# Patient Record
Sex: Male | Born: 1970 | Race: White | Hispanic: No | Marital: Married | Smoking: Former smoker
Health system: Southern US, Community
[De-identification: ages and names within clinical notes are randomized; demographics above are authoritative.]

## PROBLEM LIST (undated history)

## (undated) DIAGNOSIS — R42 Dizziness and giddiness: Secondary | ICD-10-CM

## (undated) DIAGNOSIS — G43009 Migraine without aura, not intractable, without status migrainosus: Secondary | ICD-10-CM

## (undated) DIAGNOSIS — K219 Gastro-esophageal reflux disease without esophagitis: Secondary | ICD-10-CM

## (undated) DIAGNOSIS — I2510 Atherosclerotic heart disease of native coronary artery without angina pectoris: Secondary | ICD-10-CM

## (undated) HISTORY — PX: ESOPHAGEAL DILATION: SHX303

## (undated) HISTORY — DX: Migraine without aura, not intractable, without status migrainosus: G43.009

## (undated) HISTORY — DX: Dizziness and giddiness: R42

---

## 2012-05-23 ENCOUNTER — Ambulatory Visit
Admission: RE | Admit: 2012-05-23 | Discharge: 2012-05-23 | Disposition: A | Discharge status: Home / Self Care | Payer: BC Managed Care – PPO | Source: Ambulatory Visit | Attending: Otolaryngology | Admitting: Otolaryngology

## 2012-05-23 ENCOUNTER — Other Ambulatory Visit: Payer: Self-pay | Admitting: Otolaryngology

## 2012-05-23 DIAGNOSIS — R51 Headache: Secondary | ICD-10-CM

## 2012-06-06 ENCOUNTER — Other Ambulatory Visit: Payer: Self-pay | Admitting: Family Medicine

## 2012-06-06 DIAGNOSIS — R209 Unspecified disturbances of skin sensation: Secondary | ICD-10-CM

## 2012-06-10 ENCOUNTER — Ambulatory Visit
Admission: RE | Admit: 2012-06-10 | Discharge: 2012-06-10 | Disposition: A | Discharge status: Home / Self Care | Payer: BC Managed Care – PPO | Source: Ambulatory Visit | Attending: Family Medicine | Admitting: Family Medicine

## 2012-06-10 DIAGNOSIS — R209 Unspecified disturbances of skin sensation: Secondary | ICD-10-CM

## 2012-06-10 MED ORDER — GADOBENATE DIMEGLUMINE 529 MG/ML IV SOLN
17.0000 mL | Freq: Once | INTRAVENOUS | Status: AC | PRN
  Administered 2012-06-10: 17 mL via INTRAVENOUS

## 2014-05-17 ENCOUNTER — Emergency Department (HOSPITAL_BASED_OUTPATIENT_CLINIC_OR_DEPARTMENT_OTHER)
Admission: EM | Admit: 2014-05-17 | Discharge: 2014-05-17 | Disposition: A | Discharge status: Home / Self Care | Payer: BC Managed Care – PPO | Attending: Emergency Medicine | Admitting: Emergency Medicine

## 2014-05-17 ENCOUNTER — Encounter (HOSPITAL_BASED_OUTPATIENT_CLINIC_OR_DEPARTMENT_OTHER): Payer: Self-pay

## 2014-05-17 DIAGNOSIS — Z79899 Other long term (current) drug therapy: Secondary | ICD-10-CM | POA: Insufficient documentation

## 2014-05-17 DIAGNOSIS — Y9389 Activity, other specified: Secondary | ICD-10-CM | POA: Diagnosis not present

## 2014-05-17 DIAGNOSIS — Z9889 Other specified postprocedural states: Secondary | ICD-10-CM | POA: Insufficient documentation

## 2014-05-17 DIAGNOSIS — T18108A Unspecified foreign body in esophagus causing other injury, initial encounter: Secondary | ICD-10-CM

## 2014-05-17 DIAGNOSIS — Y9289 Other specified places as the place of occurrence of the external cause: Secondary | ICD-10-CM | POA: Diagnosis not present

## 2014-05-17 DIAGNOSIS — T18128A Food in esophagus causing other injury, initial encounter: Secondary | ICD-10-CM | POA: Insufficient documentation

## 2014-05-17 DIAGNOSIS — K219 Gastro-esophageal reflux disease without esophagitis: Secondary | ICD-10-CM | POA: Insufficient documentation

## 2014-05-17 HISTORY — DX: Gastro-esophageal reflux disease without esophagitis: K21.9

## 2014-05-17 NOTE — ED Notes (Signed)
Pt reports was eating chicken and has a piece lodged in throat.  Reports can get water down some but comes right back up.  Pt has hiccups at this time.  Reports this has happened before and never had to have EGD and has a hx of esphogeal stretching. Airway not compromised at this time.

## 2014-05-17 NOTE — Discharge Instructions (Signed)
Contact your gastroenterologist tomorrow to schedule an office visit. Tell him about tonight's visit here.

## 2014-05-17 NOTE — ED Provider Notes (Signed)
CSN: [id number]     Arrival date & time 05/17/14  1956 History  This chart was scribed for Orlie Dakin, MD by Tula Nakayama, ED Scribe. This patient was seen in room MH11/MH11 and the patient's care was started at 8:35 PM.    Chief Complaint  Patient presents with  . Foreign Body   The history is provided by the patient. No language interpreter was used.    HPI Comments: Gilbert Briggs is a 43 y.o. male who presents to the Emergency Department complaining of a piece of chicken that was lodged in his esophagus 2 hours ago(points to mid chest). Pt states symptoms are improvingsteadily with time with time since he vomited. He no longer has a foreign body sensation, only feels vague discomfort at mid chest where food bolus was prior to vomiting. Pt's wife notes he has a history of similar symptoms and that he has had similar episodes 4 times in the last 4 months. Pt has history of esophageal dilation and an esophageal ring that went away. Pt takes omeprazole, but has not taken it in a few days because he ran out. Pt denies smoking, EtOH use, and drug abuse. He denies trying any treatment prior to coming to the ED. Pt drank water in the ED and notes feeling that the obstruction has passed.  Past Medical History  Diagnosis Date  . GERD (gastroesophageal reflux disease)    Past Surgical History  Procedure Laterality Date  . Esophageal dilation     No family history on file. History  Substance Use Topics  . Smoking status: Never Smoker   . Smokeless tobacco: Not on file  . Alcohol Use: No    Review of Systems  Constitutional: Negative for fever, chills, diaphoresis, appetite change and fatigue.  HENT: Negative for mouth sores, sore throat and trouble swallowing.   Eyes: Negative for visual disturbance.  Respiratory: Negative for cough, chest tightness, shortness of breath and wheezing.   Cardiovascular: Negative for chest pain.  Gastrointestinal: Positive for vomiting. Negative for  nausea, abdominal pain, diarrhea and abdominal distention.       Esophageal food bolus  Endocrine: Negative for polydipsia, polyphagia and polyuria.  Genitourinary: Negative for dysuria, frequency and hematuria.  Musculoskeletal: Negative for gait problem.  Skin: Negative for color change, pallor and rash.  Neurological: Negative for dizziness, syncope, light-headedness and headaches.  Hematological: Does not bruise/bleed easily.  Psychiatric/Behavioral: Negative for behavioral problems and confusion.  All other systems reviewed and are negative.     Allergies  Review of patient's allergies indicates no known allergies.  Home Medications   Prior to Admission medications   Medication Sig Start Date End Date Taking? Authorizing Provider  omeprazole (PRILOSEC) 40 MG capsule Take 40 mg by mouth daily.   Yes Historical Provider, MD   BP 143/85 mmHg  Pulse 68  Temp(Src) 98 F (36.7 C) (Oral)  Resp 20  Ht 5\' 11"  (1.803 m)  Wt 170 lb (77.111 kg)  BMI 23.72 kg/m2  SpO2 100% Physical Exam  Constitutional: He appears well-developed and well-nourished. No distress.  HENT:  Head: Normocephalic and atraumatic.  Eyes: Conjunctivae are normal. Pupils are equal, round, and reactive to light.  Neck: Neck supple. No tracheal deviation present. No thyromegaly present.  Cardiovascular: Normal rate and regular rhythm.   No murmur heard. Pulmonary/Chest: Effort normal and breath sounds normal.  Abdominal: Soft. Bowel sounds are normal. He exhibits no distension. There is no tenderness.  Musculoskeletal: Normal range of motion. He exhibits  no edema or tenderness.  Neurological: He is alert. Coordination normal.  Skin: Skin is warm and dry. No rash noted.  Psychiatric: He has a normal mood and affect.  Nursing note and vitals reviewed.   ED Course  Procedures (including critical care time) DIAGNOSTIC STUDIES: Oxygen Saturation is 100% on RA, normal by my interpretation.    COORDINATION  OF CARE: 8:45 PM Discussed treatment plan with pt at bedside and pt agreed to plan.  Labs Review Labs Reviewed - No data to display  Imaging Review No results found.   EKG Interpretation None     8:50 PMPatient to drink water without difficulty 9 PM patient asymptomatic MDM   All symptoms have resolved. Esophageal food bolus has been expelled spontaneously Plan contact gastroenterologist tomorrow for follow-up Diagnosis esophageal foreign body Final diagnoses:  None     I personally performed the services described in this documentation, which was scribed in my presence. The recorded information has been reviewed and considered.     Orlie Dakin, MD 05/17/14 2106

## 2015-11-06 DIAGNOSIS — M25571 Pain in right ankle and joints of right foot: Secondary | ICD-10-CM | POA: Diagnosis not present

## 2015-11-06 DIAGNOSIS — S93401A Sprain of unspecified ligament of right ankle, initial encounter: Secondary | ICD-10-CM | POA: Diagnosis not present

## 2016-06-15 DIAGNOSIS — K219 Gastro-esophageal reflux disease without esophagitis: Secondary | ICD-10-CM | POA: Diagnosis not present

## 2016-06-15 DIAGNOSIS — R1310 Dysphagia, unspecified: Secondary | ICD-10-CM | POA: Diagnosis not present

## 2016-08-08 DIAGNOSIS — A491 Streptococcal infection, unspecified site: Secondary | ICD-10-CM | POA: Diagnosis not present

## 2016-08-16 DIAGNOSIS — H5213 Myopia, bilateral: Secondary | ICD-10-CM | POA: Diagnosis not present

## 2016-08-23 DIAGNOSIS — K219 Gastro-esophageal reflux disease without esophagitis: Secondary | ICD-10-CM | POA: Diagnosis not present

## 2016-08-23 DIAGNOSIS — Z79899 Other long term (current) drug therapy: Secondary | ICD-10-CM | POA: Diagnosis not present

## 2016-08-23 DIAGNOSIS — E559 Vitamin D deficiency, unspecified: Secondary | ICD-10-CM | POA: Diagnosis not present

## 2016-08-23 DIAGNOSIS — Z0000 Encounter for general adult medical examination without abnormal findings: Secondary | ICD-10-CM | POA: Diagnosis not present

## 2016-08-29 DIAGNOSIS — J069 Acute upper respiratory infection, unspecified: Secondary | ICD-10-CM | POA: Diagnosis not present

## 2016-09-28 DIAGNOSIS — L821 Other seborrheic keratosis: Secondary | ICD-10-CM | POA: Diagnosis not present

## 2016-09-28 DIAGNOSIS — D225 Melanocytic nevi of trunk: Secondary | ICD-10-CM | POA: Diagnosis not present

## 2016-09-28 DIAGNOSIS — D2261 Melanocytic nevi of right upper limb, including shoulder: Secondary | ICD-10-CM | POA: Diagnosis not present

## 2016-09-28 DIAGNOSIS — L812 Freckles: Secondary | ICD-10-CM | POA: Diagnosis not present

## 2017-02-12 DIAGNOSIS — N50819 Testicular pain, unspecified: Secondary | ICD-10-CM | POA: Diagnosis not present

## 2017-02-12 DIAGNOSIS — R3911 Hesitancy of micturition: Secondary | ICD-10-CM | POA: Diagnosis not present

## 2017-02-26 DIAGNOSIS — R1030 Lower abdominal pain, unspecified: Secondary | ICD-10-CM | POA: Diagnosis not present

## 2017-02-26 DIAGNOSIS — N4340 Spermatocele of epididymis, unspecified: Secondary | ICD-10-CM | POA: Diagnosis not present

## 2017-02-26 DIAGNOSIS — N3281 Overactive bladder: Secondary | ICD-10-CM | POA: Diagnosis not present

## 2017-03-08 DIAGNOSIS — K1370 Unspecified lesions of oral mucosa: Secondary | ICD-10-CM | POA: Diagnosis not present

## 2017-03-27 DIAGNOSIS — R42 Dizziness and giddiness: Secondary | ICD-10-CM | POA: Diagnosis not present

## 2017-03-29 DIAGNOSIS — N4340 Spermatocele of epididymis, unspecified: Secondary | ICD-10-CM | POA: Diagnosis not present

## 2017-03-29 DIAGNOSIS — R350 Frequency of micturition: Secondary | ICD-10-CM | POA: Diagnosis not present

## 2017-04-09 DIAGNOSIS — H8113 Benign paroxysmal vertigo, bilateral: Secondary | ICD-10-CM | POA: Diagnosis not present

## 2017-04-16 DIAGNOSIS — H8113 Benign paroxysmal vertigo, bilateral: Secondary | ICD-10-CM | POA: Diagnosis not present

## 2017-04-23 DIAGNOSIS — H8113 Benign paroxysmal vertigo, bilateral: Secondary | ICD-10-CM | POA: Diagnosis not present

## 2017-04-26 DIAGNOSIS — F430 Acute stress reaction: Secondary | ICD-10-CM | POA: Diagnosis not present

## 2017-04-30 DIAGNOSIS — H8113 Benign paroxysmal vertigo, bilateral: Secondary | ICD-10-CM | POA: Diagnosis not present

## 2017-05-07 DIAGNOSIS — H8113 Benign paroxysmal vertigo, bilateral: Secondary | ICD-10-CM | POA: Diagnosis not present

## 2017-05-24 DIAGNOSIS — R42 Dizziness and giddiness: Secondary | ICD-10-CM | POA: Diagnosis not present

## 2017-05-24 DIAGNOSIS — H903 Sensorineural hearing loss, bilateral: Secondary | ICD-10-CM | POA: Diagnosis not present

## 2017-05-24 DIAGNOSIS — H9313 Tinnitus, bilateral: Secondary | ICD-10-CM | POA: Diagnosis not present

## 2017-06-15 ENCOUNTER — Ambulatory Visit (INDEPENDENT_AMBULATORY_CARE_PROVIDER_SITE_OTHER): Payer: BLUE CROSS/BLUE SHIELD | Admitting: Psychology

## 2017-06-15 DIAGNOSIS — F4322 Adjustment disorder with anxiety: Secondary | ICD-10-CM | POA: Diagnosis not present

## 2017-07-04 ENCOUNTER — Ambulatory Visit (INDEPENDENT_AMBULATORY_CARE_PROVIDER_SITE_OTHER): Payer: BLUE CROSS/BLUE SHIELD | Admitting: Psychology

## 2017-07-04 DIAGNOSIS — F4322 Adjustment disorder with anxiety: Secondary | ICD-10-CM

## 2017-07-20 ENCOUNTER — Ambulatory Visit: Payer: BLUE CROSS/BLUE SHIELD | Admitting: Psychology

## 2017-07-20 DIAGNOSIS — R42 Dizziness and giddiness: Secondary | ICD-10-CM | POA: Diagnosis not present

## 2017-07-20 DIAGNOSIS — H903 Sensorineural hearing loss, bilateral: Secondary | ICD-10-CM | POA: Diagnosis not present

## 2017-07-20 DIAGNOSIS — H9313 Tinnitus, bilateral: Secondary | ICD-10-CM | POA: Diagnosis not present

## 2017-08-01 ENCOUNTER — Ambulatory Visit (INDEPENDENT_AMBULATORY_CARE_PROVIDER_SITE_OTHER): Payer: BLUE CROSS/BLUE SHIELD | Admitting: Psychology

## 2017-08-01 DIAGNOSIS — F4322 Adjustment disorder with anxiety: Secondary | ICD-10-CM

## 2017-08-16 DIAGNOSIS — R1310 Dysphagia, unspecified: Secondary | ICD-10-CM | POA: Diagnosis not present

## 2017-08-16 DIAGNOSIS — R42 Dizziness and giddiness: Secondary | ICD-10-CM | POA: Diagnosis not present

## 2017-08-16 DIAGNOSIS — K219 Gastro-esophageal reflux disease without esophagitis: Secondary | ICD-10-CM | POA: Diagnosis not present

## 2017-08-16 DIAGNOSIS — H903 Sensorineural hearing loss, bilateral: Secondary | ICD-10-CM | POA: Diagnosis not present

## 2017-08-17 DIAGNOSIS — D179 Benign lipomatous neoplasm, unspecified: Secondary | ICD-10-CM | POA: Diagnosis not present

## 2017-08-21 ENCOUNTER — Other Ambulatory Visit: Payer: Self-pay | Admitting: Otolaryngology

## 2017-08-21 DIAGNOSIS — R42 Dizziness and giddiness: Secondary | ICD-10-CM

## 2017-08-22 DIAGNOSIS — H5211 Myopia, right eye: Secondary | ICD-10-CM | POA: Diagnosis not present

## 2017-08-23 ENCOUNTER — Ambulatory Visit (INDEPENDENT_AMBULATORY_CARE_PROVIDER_SITE_OTHER): Payer: BLUE CROSS/BLUE SHIELD | Admitting: Psychology

## 2017-08-23 DIAGNOSIS — F4322 Adjustment disorder with anxiety: Secondary | ICD-10-CM

## 2017-08-26 ENCOUNTER — Ambulatory Visit
Admission: RE | Admit: 2017-08-26 | Discharge: 2017-08-26 | Disposition: A | Discharge status: Home / Self Care | Payer: BLUE CROSS/BLUE SHIELD | Source: Ambulatory Visit | Attending: Otolaryngology | Admitting: Otolaryngology

## 2017-08-26 DIAGNOSIS — R42 Dizziness and giddiness: Secondary | ICD-10-CM | POA: Diagnosis not present

## 2017-08-26 MED ORDER — GADOBENATE DIMEGLUMINE 529 MG/ML IV SOLN
15.0000 mL | Freq: Once | INTRAVENOUS | Status: AC | PRN
  Administered 2017-08-26: 15 mL via INTRAVENOUS

## 2017-09-17 ENCOUNTER — Encounter: Payer: Self-pay | Admitting: Neurology

## 2017-09-17 ENCOUNTER — Other Ambulatory Visit: Payer: Self-pay

## 2017-09-17 ENCOUNTER — Ambulatory Visit (INDEPENDENT_AMBULATORY_CARE_PROVIDER_SITE_OTHER): Payer: BLUE CROSS/BLUE SHIELD | Admitting: Neurology

## 2017-09-17 ENCOUNTER — Encounter (INDEPENDENT_AMBULATORY_CARE_PROVIDER_SITE_OTHER): Payer: Self-pay

## 2017-09-17 VITALS — BP 137/68 | HR 63 | Ht 71.0 in | Wt 189.5 lb

## 2017-09-17 DIAGNOSIS — R42 Dizziness and giddiness: Secondary | ICD-10-CM | POA: Diagnosis not present

## 2017-09-17 NOTE — Progress Notes (Signed)
Reason for visit: Dizziness, headache, paresthesias  Referring physician: Dr. Quincy Carnes Briggs is a 47 y.o. male  History of present illness:  [name] is a 47 year old right-handed white male with a history of onset of some dizziness that began in September 2018.  The patient indicated a mild sensation of imbalance, true vertigo was not experienced.  The patient felt as if he had some imbalance when he tried a walk or moved in any way.  The patient reported the onset about 2 weeks after he was at an amusement park and was riding roller coasters.  The patient did undergo some physical therapy for vestibular rehabilitation which did not help.  The patient has reported a lot of stress and anxiety associated with his job.  He feels that he is always "putting out fires" at work.  He has also noted some vibration sensations that may come on in his sleep, he may have brief episodes of tinnitus that may come and go.  He has noted muscle twitches in the legs primarily but may also occur in the arms.  The patient has also had some bifrontal and temporal headaches and may come and go over the last several months.  The patient denies a high level of caffeine intake, he has stopped drinking products with caffeine.  He usually sleeps fairly well, but not always.  The patient has undergone MRI of the brain that was completely normal.  He denies any neck pain or neck stiffness.  He has been seen by Dr. [name] and was subsequently referred here for further evaluation.  Within the last month he has noted that his symptoms have completely resolved, the headaches have gone away, the dizziness has gone away, he has not had any paresthesias.  He still has some muscle twitches at times.  He comes to this office for further evaluation.  Past Medical History:  Diagnosis Date  . Dizziness   . GERD (gastroesophageal reflux disease)     Past Surgical History:  Procedure Laterality Date  . ESOPHAGEAL DILATION        Family History  Problem Relation Age of Onset  . High blood pressure Mother   . Stroke Mother   . Breast cancer Mother   . High blood pressure Father   . Heart attack Father   . High Cholesterol Father     Social history:  reports that he has quit smoking. he has never used smokeless tobacco. He reports that he does not drink alcohol or use drugs.  Medications:  Prior to Admission medications   Medication Sig Start Date End Date Taking? Authorizing Provider  cholecalciferol (VITAMIN D) 1000 units tablet Take 1 tablet by mouth daily.    [provider]  Multiple Vitamin (MULTI-VITAMINS) TABS Take 1 tablet by mouth daily.    [provider]  Omega-3 Fatty Acids (FISH OIL) 1000 MG CAPS Take 1 capsule by mouth daily.    [provider]  omeprazole (PRILOSEC) 20 MG capsule Take 1 capsule by mouth daily. 03/14/17   [provider]     No Known Allergies  ROS:  Out of a complete 14 system review of symptoms, the patient complains only of the following symptoms, and all other reviewed systems are negative.  Palpitations of the heart Ringing in the ears Dizziness  Blood pressure 137/68, pulse 63, height 5\' 11"  (1.803 m), weight 189 lb 8 oz (86 kg).  Physical Exam  General: The patient is alert and  cooperative at the time of the examination.  Eyes: Pupils are equal, round, and reactive to light. Discs are flat bilaterally.  Good venous pulsations are seen.  Neck: The neck is supple, no carotid bruits are noted.  Respiratory: The respiratory examination is clear.  Cardiovascular: The cardiovascular examination reveals a regular rate and rhythm, no obvious murmurs or rubs are noted.  Neuromuscular: Range of movement the cervical spine was full.  No crepitus is noted in the temporal mandibular joints.  Skin: Extremities are without significant edema.  Neurologic Exam  Mental status: The patient is alert and oriented x 3 at the time of the  examination. The patient has apparent normal recent and remote memory, with an apparently normal attention span and concentration ability.  Cranial nerves: Facial symmetry is present. There is good sensation of the face to pinprick and soft touch bilaterally. The strength of the facial muscles and the muscles to head turning and shoulder shrug are normal bilaterally. Speech is well enunciated, no aphasia or dysarthria is noted. Extraocular movements are full. Visual fields are full. The tongue is midline, and the patient has symmetric elevation of the soft palate. No obvious hearing deficits are noted.  Motor: The motor testing reveals 5 over 5 strength of all 4 extremities. Good symmetric motor tone is noted throughout.  Sensory: Sensory testing is intact to pinprick, soft touch, vibration sensation, and position sense on all 4 extremities. No evidence of extinction is noted.  Coordination: Cerebellar testing reveals good finger-nose-finger and heel-to-shin bilaterally.  Gait and station: Gait is normal. Tandem gait is normal. Romberg is negative. No drift is seen.  Reflexes: Deep tendon reflexes are symmetric and normal bilaterally. Toes are downgoing bilaterally.    MRI brain 08/26/17:  IMPRESSION: Normal brain MRI.  * MRI scan images were reviewed online. I agree with the written report.    Assessment/Plan:  1.  Episodic dizziness, headache, paresthesias, muscle twitches  2.  Anxiety, stress  The patient has had resolution of all of his symptoms within the last month.  MRI of the brain has been completely normal.  The patient may potentially have stress related symptoms.  There is no evidence of demyelinating disease.  The headaches have improved, at times migraine may cause dizziness as well.  We will not initiate any further evaluation or treatment unless his symptoms return.  We may consider the use of propranolol in the future.  The patient may seek another job to reduce stress  in his life which may in the long run be beneficial to his health.  Jill Alexanders MD 09/17/2017 8:26 AM  [facility] [address] [place], [place] [address]  Phone [phone] Fax [phone]

## 2017-09-20 ENCOUNTER — Ambulatory Visit: Payer: BLUE CROSS/BLUE SHIELD | Admitting: Psychology

## 2017-10-08 DIAGNOSIS — K219 Gastro-esophageal reflux disease without esophagitis: Secondary | ICD-10-CM | POA: Diagnosis not present

## 2017-10-08 DIAGNOSIS — Z0000 Encounter for general adult medical examination without abnormal findings: Secondary | ICD-10-CM | POA: Diagnosis not present

## 2017-10-08 DIAGNOSIS — Z125 Encounter for screening for malignant neoplasm of prostate: Secondary | ICD-10-CM | POA: Diagnosis not present

## 2017-10-08 DIAGNOSIS — E559 Vitamin D deficiency, unspecified: Secondary | ICD-10-CM | POA: Diagnosis not present

## 2017-10-08 DIAGNOSIS — Z79899 Other long term (current) drug therapy: Secondary | ICD-10-CM | POA: Diagnosis not present

## 2017-10-16 DIAGNOSIS — H5213 Myopia, bilateral: Secondary | ICD-10-CM | POA: Diagnosis not present

## 2017-10-31 ENCOUNTER — Ambulatory Visit: Payer: BLUE CROSS/BLUE SHIELD | Admitting: Psychology

## 2017-12-07 DIAGNOSIS — L821 Other seborrheic keratosis: Secondary | ICD-10-CM | POA: Diagnosis not present

## 2017-12-07 DIAGNOSIS — D1801 Hemangioma of skin and subcutaneous tissue: Secondary | ICD-10-CM | POA: Diagnosis not present

## 2017-12-07 DIAGNOSIS — L812 Freckles: Secondary | ICD-10-CM | POA: Diagnosis not present

## 2017-12-07 DIAGNOSIS — D225 Melanocytic nevi of trunk: Secondary | ICD-10-CM | POA: Diagnosis not present

## 2017-12-24 ENCOUNTER — Ambulatory Visit: Payer: BLUE CROSS/BLUE SHIELD | Admitting: Psychology

## 2018-01-07 DIAGNOSIS — H10021 Other mucopurulent conjunctivitis, right eye: Secondary | ICD-10-CM | POA: Diagnosis not present

## 2018-03-26 DIAGNOSIS — M7660 Achilles tendinitis, unspecified leg: Secondary | ICD-10-CM | POA: Diagnosis not present

## 2018-04-01 DIAGNOSIS — M7660 Achilles tendinitis, unspecified leg: Secondary | ICD-10-CM | POA: Diagnosis not present

## 2018-04-08 DIAGNOSIS — M7660 Achilles tendinitis, unspecified leg: Secondary | ICD-10-CM | POA: Diagnosis not present

## 2018-04-22 DIAGNOSIS — M7660 Achilles tendinitis, unspecified leg: Secondary | ICD-10-CM | POA: Diagnosis not present

## 2018-07-01 ENCOUNTER — Ambulatory Visit (INDEPENDENT_AMBULATORY_CARE_PROVIDER_SITE_OTHER): Payer: BLUE CROSS/BLUE SHIELD | Admitting: Sports Medicine

## 2018-07-01 ENCOUNTER — Encounter: Payer: Self-pay | Admitting: Sports Medicine

## 2018-07-01 VITALS — BP 132/82 | Ht 71.0 in | Wt 175.0 lb

## 2018-07-01 DIAGNOSIS — M7662 Achilles tendinitis, left leg: Secondary | ICD-10-CM | POA: Diagnosis not present

## 2018-07-01 MED ORDER — NITROGLYCERIN 0.2 MG/HR TD PT24: MEDICATED_PATCH | TRANSDERMAL | 1 refills | Status: DC

## 2018-07-01 NOTE — Assessment & Plan Note (Signed)
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  He has reduced his running went through physical therapy and laser treatments with no resolution.  Patient will be given eccentric strengthening exercises, nitroglycerin patch, and will follow-up in 6 to 8 weeks.

## 2018-07-01 NOTE — Progress Notes (Signed)
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  He said he has had this pain for over 6 months and went to his PCP for it as he was planning a Ironman in October.  He was sent to physical therapy and given some laser treatment which he said worked well for him and allowed him to get through the [place].  It never actually fully resolved his discomfort, but did make it bearable and he was able to finish the race.  Since then he has decreased his running to almost nothing which he defines as 3 miles per week.  He feels that time with weight lifting cycling and biking.  Pain is become a nuisance and he is looking for definitive treatment, even if it requires total rest.  Would prefer not to use medication.  He denies any specific injury or any pain in either location  CC: Ankle pain  Traumatic: No (Yes? MOI)  Location: Left ankle, mid Achilles tendon Quality: Achy (type of pain)  Duration: Over 6 months Timing: Worse with palpation and long running (nighttime/exercise/prolonged sitting)  Improving/Worsening: Stable Makes better: Rest Makes worse: Chronic use or palpation Associated symptoms: None  Previous Interventions Tried: History of physical therapy and laser treatments, some ice  Past Injuries: None claimed Past Surgeries: None claimed Smoking: No Family Hx: Not discussed  ROS: Per HPI; in addition no fever, no rash, no additional weakness, no additional numbness, no additional paresthesias, and no additional falls/injury.   Objective: BP 132/82   Ht 5\' 11"  (1.803 m)   Wt 175 lb (79.4 kg)   BMI 24.41 kg/m  Gen:  NAD, well groomed, a/o x3, normal affect.  CV: Well-perfused. Warm.  Resp: Non-labored.  Neuro: Sensation intact throughout. No gross coordination deficits.  Gait: Nonpathologic posture, unremarkable stride without signs of limp or balance issues. Left ankle/achiles: No visible  pathology, erythema, swelling, bruising.  Ankle appears to be in proper alignment.  No fluid collection or bony abnormality to palpation, there is pain to palpation along middle of Achilles tendon.  Range of motion preserved.  Strength within normal limits.  No pain to internal or external rotation of ankle no anterior posterior laxity.  No sensation or vascular deficits noted.  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  He has reduced his running went through physical therapy and laser treatments with no resolution.  Patient will be given eccentric strengthening exercises, nitroglycerin patch, and will follow-up in 6 to 8 weeks.   No orders of the defined types were placed in this encounter.   No orders of the defined types were placed in this encounter.    Sherene Sires, DO PGY2 Resident 07/01/2018 2:34 PM   Patient seen and evaluated with the resident.  I agree with the above plan of care.  Patient is tender to palpation at the mid substance of the Achilles tendon and his ultrasound shows thickening in this area as well as some hypoechoic changes on the short axis view consistent with tendinopathy.  We will start a nitroglycerin protocol and he will start an eccentric heel drop home exercise program.  I have also given him heel lifts to wear in his running shoes.  I do think he can continue running using pain as his guide but I recommended against sprinting or jumping.  Follow-up with me in 6 to 8 weeks for reevaluation.  I will only consider repeat ultrasound if his symptoms are worsening as follow-up ultrasounds tend to lag behind clinical healing.

## 2018-07-01 NOTE — Patient Instructions (Signed)

## 2018-07-02 ENCOUNTER — Encounter: Payer: Self-pay | Admitting: Sports Medicine

## 2018-07-09 DIAGNOSIS — L03011 Cellulitis of right finger: Secondary | ICD-10-CM | POA: Diagnosis not present

## 2018-08-12 ENCOUNTER — Ambulatory Visit: Payer: BLUE CROSS/BLUE SHIELD | Admitting: Sports Medicine

## 2018-08-16 DIAGNOSIS — H9313 Tinnitus, bilateral: Secondary | ICD-10-CM | POA: Diagnosis not present

## 2018-08-16 DIAGNOSIS — R42 Dizziness and giddiness: Secondary | ICD-10-CM | POA: Diagnosis not present

## 2018-08-19 DIAGNOSIS — R51 Headache: Secondary | ICD-10-CM | POA: Diagnosis not present

## 2018-08-19 DIAGNOSIS — R42 Dizziness and giddiness: Secondary | ICD-10-CM | POA: Diagnosis not present

## 2018-08-19 DIAGNOSIS — R2689 Other abnormalities of gait and mobility: Secondary | ICD-10-CM | POA: Diagnosis not present

## 2018-08-28 ENCOUNTER — Encounter: Payer: Self-pay | Admitting: Neurology

## 2018-08-28 ENCOUNTER — Ambulatory Visit (INDEPENDENT_AMBULATORY_CARE_PROVIDER_SITE_OTHER): Payer: BLUE CROSS/BLUE SHIELD | Admitting: Neurology

## 2018-08-28 VITALS — BP 143/84 | HR 53 | Ht 71.0 in | Wt 180.0 lb

## 2018-08-28 DIAGNOSIS — G43009 Migraine without aura, not intractable, without status migrainosus: Secondary | ICD-10-CM | POA: Diagnosis not present

## 2018-08-28 DIAGNOSIS — R42 Dizziness and giddiness: Secondary | ICD-10-CM

## 2018-08-28 HISTORY — DX: Migraine without aura, not intractable, without status migrainosus: G43.009

## 2018-08-28 MED ORDER — TOPIRAMATE 25 MG PO TABS: ORAL_TABLET | ORAL | 3 refills | Status: DC

## 2018-08-28 NOTE — Progress Notes (Signed)
Reason for visit: Dizziness, headache  Gilbert Briggs is an 48 y.o. male  History of present illness:  Gilbert Briggs is a 48 year old right-handed white male with a history of dizziness and headaches, he was seen approximately 1 year ago for this issue.  MRI of the brain was normal.  He had been seen through ENT, no abnormalities were seen.  The patient seemed to resolve his symptoms, he has done well throughout the year, but in mid January 2020, the patient was on a trip, he noted some episodes of dizziness when coming off an elevator.  Since that time, he has had some increase in symptoms with feeling bouncy when he walks, he is very sensitive to vibration, he does not have true vertigo or nausea, but he does feel lightheaded and unsteady.  He began having headaches that are retro-orbital and temporal headache in December 2019, at which time he was working longer hours, and spending more time on the computer.  He has cut back on his computer time, but he still has some headaches and eye discomfort.  The headaches are not associated with nausea or vomiting, he does not have photophobia or phonophobia.  He denies hearing changes or ear pain, he has returned to his ENT physician, no abnormalities were noted.  The patient did have audiometric testing, no evidence of vestibular dysfunction was noted.  He reports no numbness of the extremities or weakness.  He reports no slurred speech or difficulty swallowing.  He is sent back to this office for an evaluation.   Past Medical History:  Diagnosis Date  . Dizziness   . GERD (gastroesophageal reflux disease)     Past Surgical History:  Procedure Laterality Date  . ESOPHAGEAL DILATION      Family History  Problem Relation Age of Onset  . High blood pressure Mother   . Stroke Mother   . Breast cancer Mother   . High blood pressure Father   . Heart attack Father   . High Cholesterol Father     Social history:  reports that he has quit smoking. He  has never used smokeless tobacco. He reports that he does not drink alcohol or use drugs.   No Known Allergies  Medications:  Prior to Admission medications   Medication Sig Start Date End Date Taking? Authorizing Provider  cholecalciferol (VITAMIN D) 1000 units tablet Take 2,000 Units by mouth daily.     [provider]  Multiple Vitamin (MULTI-VITAMINS) TABS Take 1 tablet by mouth daily. Centrum    [provider]  nitroGLYCERIN (NITRODUR - DOSED IN MG/24 HR) 0.2 mg/hr patch Use 1/4 patch daily to the affected area. 07/01/18   Thurman Coyer, DO  Omega-3 Fatty Acids (FISH OIL) 1000 MG CAPS Take 1 capsule by mouth daily.    [provider]  omeprazole (PRILOSEC) 20 MG capsule Take 1 capsule by mouth daily. 03/14/17   [provider]    ROS:  Out of a complete 14 system review of symptoms, the patient complains only of the following symptoms, and all other reviewed systems are negative.  Light sensitivity, eye pain Dizziness, headache  Blood pressure (!) 143/84, pulse (!) 53, height 5\' 11"  (1.803 m), weight 180 lb (81.6 kg).  Physical Exam  General: The patient is alert and cooperative at the time of the examination.  Skin: No significant peripheral edema is noted.   Neurologic Exam  Mental status: The patient is alert and oriented x 3 at  the time of the examination. The patient has apparent normal recent and remote memory, with an apparently normal attention span and concentration ability.   Cranial nerves: Facial symmetry is present. Speech is normal, no aphasia or dysarthria is noted. Extraocular movements are full. Visual fields are full.  Motor: The patient has good strength in all 4 extremities.  Sensory examination: Soft touch sensation is symmetric on the face, arms, and legs.  Coordination: The patient has good finger-nose-finger and heel-to-shin bilaterally.  Gait and station: The patient has a normal gait. Tandem gait is  normal. Romberg is negative. No drift is seen.  Reflexes: Deep tendon reflexes are symmetric.   Assessment/Plan:  1.  Headache  2.  Dizziness  The patient could potentially have migrainous features, individuals with a migraine oftentimes are more likely to have motion sickness.  The patient will be placed on Topamax working up to 50 mg at night.  Propranolol cannot be used as his resting heart rate is 53.  The patient will contact me for any dose adjustments of the medication.  He will follow-up here in about 4 months.  Jill Alexanders MD 08/28/2018 10:28 AM  [facility] [address] [place], [place] [address]  Phone [phone] Fax [phone]

## 2018-08-28 NOTE — Patient Instructions (Signed)
    We will start Topamax for the headache and dizziness.  Topamax (topiramate) is a seizure medication that has an FDA approval for seizures and for migraine headache. Potential side effects of this medication include weight loss, cognitive slowing, tingling in the fingers and toes, and carbonated drinks will taste bad. If any significant side effects are noted on this drug, please contact our office.  

## 2018-09-10 ENCOUNTER — Ambulatory Visit: Payer: BLUE CROSS/BLUE SHIELD | Admitting: Sports Medicine

## 2018-09-10 DIAGNOSIS — H5213 Myopia, bilateral: Secondary | ICD-10-CM | POA: Diagnosis not present

## 2018-09-16 ENCOUNTER — Telehealth: Payer: Self-pay | Admitting: Neurology

## 2018-09-16 NOTE — Telephone Encounter (Signed)
I contacted the pt. He states since taking the topamax his tinnitus has increased and is unsure if increasing the dosage would be the best thing for him. I advised Dr. Jannifer Franklin is out of the office today but would return tomorrow 09/17/18. I advised if he was agreeable I would discuss this with Dr. Jannifer Franklin tomorrow and get his recommendations. Pt was agreeable until waiting for Dr. Jannifer Franklin to return for his concerns to be addressed.

## 2018-09-16 NOTE — Telephone Encounter (Signed)
Pt states that he has been on topiramate (TOPAMAX) 25 MG tablet and is supposed to increase the dosage but he wanted to inform us of his increase of tinnitus before he does that. Please advise.

## 2018-09-17 NOTE — Telephone Encounter (Signed)
I called the patient.  The patient had some tinnitus on the Topamax, the sensation of being somewhat balance is still a problem, not definitely worse.  He is to stay on the 25 mg dose for another week, if the symptoms do not improve, we will stop the Topamax, he will call me, we may try nortriptyline at night instead.

## 2018-09-23 MED ORDER — NORTRIPTYLINE HCL 10 MG PO CAPS: ORAL_CAPSULE | ORAL | 3 refills | Status: DC

## 2018-09-23 NOTE — Telephone Encounter (Signed)
Patient has not been able to tolerate the Topamax, he had increased tinnitus and anxiety, we will stop the drug, he will start low-dose nortriptyline for the headache.

## 2018-09-23 NOTE — Addendum Note (Signed)
Addended by: Kathrynn Ducking on: 09/23/2018 08:39 AM   Modules accepted: Orders

## 2018-09-23 NOTE — Telephone Encounter (Signed)
Pt called he was having increased dizziness, nervousness, anxiety, nauseated on topamax. He stopped taking it over the weekend. He does feel better, the side effects have subsided a little bit. What is recommended at this time. He can be reached at [phone]

## 2018-09-24 IMAGING — MR MR HEAD WO/W CM
13 series · 48 of 48 positions shown · IV contrast (multihance)
Comparison: 06/10/2012

CLINICAL DATA: Vertigo and headache for 5 months.

EXAM:
MRI HEAD WITHOUT AND WITH CONTRAST
TECHNIQUE: Multiplanar, multiecho pulse sequences of the brain and surrounding
structures were obtained without and with intravenous contrast.
CONTRAST:  15mL MULTIHANCE GADOBENATE DIMEGLUMINE 529 MG/ML IV SOLN

[Series 5: DWI · axial · 3.0mm · 1.50mm/px · z∈[-76,+82]mm · 4 of 98 slices shown (1 of 4)]
[im 1/98]
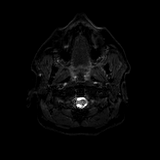
[im 33/98]
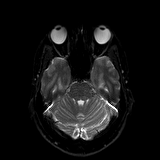
[im 65/98]
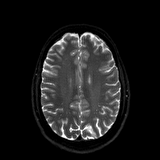
[im 98/98]
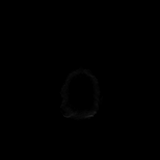

[Series 6: DWI · axial · 3.0mm · 1.50mm/px · z∈[-76,+82]mm · 3 of 48 slices shown (2 of 4)]
[im 1/48]
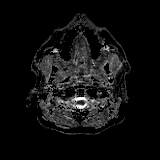
[im 24/48]
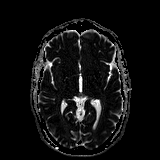
[im 48/48]
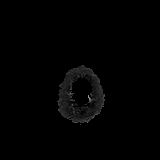

[Series 7: T2 · axial · 4.0mm · 0.38mm/px · z∈[-77,+85]mm · 2 of 32 slices shown]
[im 1/32]
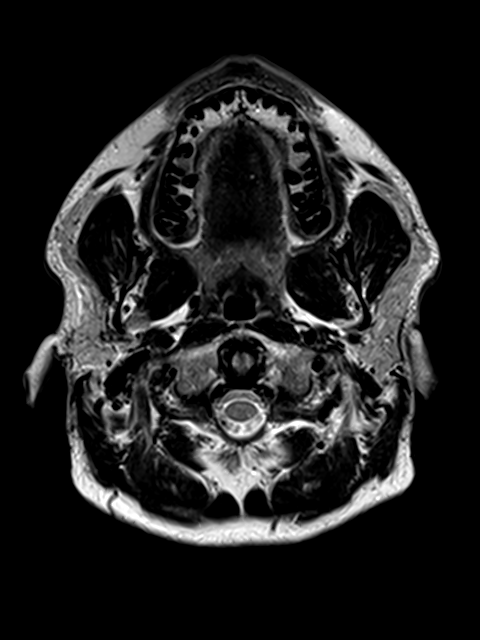
[im 32/32]
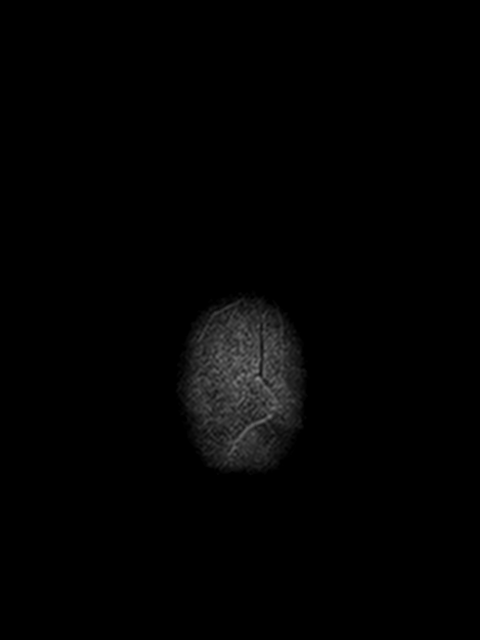

[Series 8: FLAIR · axial · 3.0mm · 0.75mm/px · 1 of 28 slices shown]
[im 1/28]
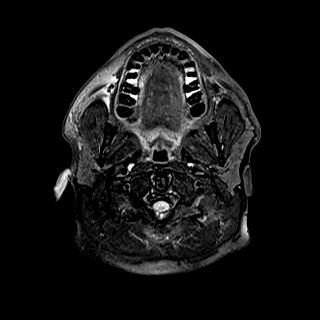

[Series 11: T1 · axial · 1.0mm · 0.94mm/px · z∈[-75,+83]mm · 8 of 160 slices shown (1 of 3)]
[im 1/160]
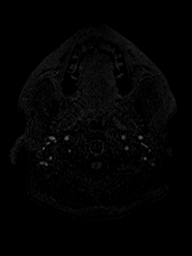
[im 23/160]
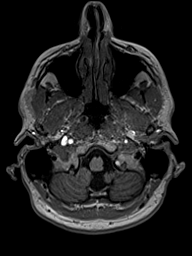
[im 46/160]
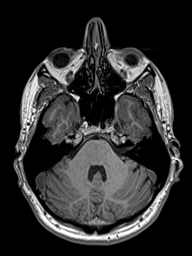
[im 69/160]
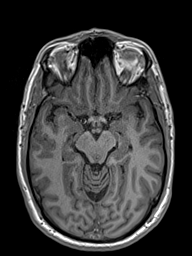
[im 91/160]
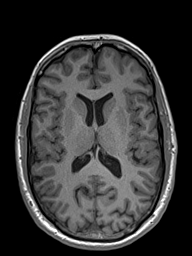
[im 114/160]
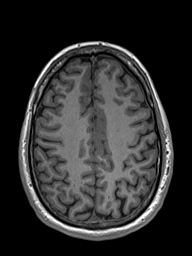
[im 137/160]
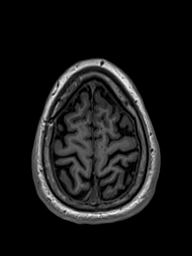
[im 160/160]
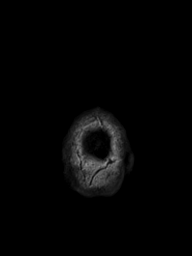

[Series 12: mip_images(sw) · axial · 12.0mm · 0.94mm/px · z∈[-59,+73]mm · 5 of 89 slices shown]
[im 1/89]
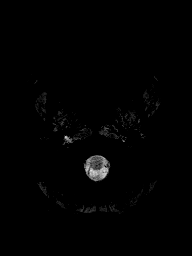
[im 23/89]
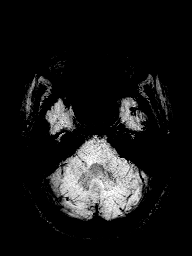
[im 45/89]
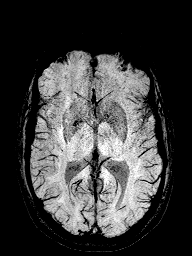
[im 67/89]
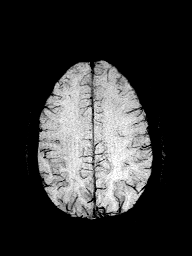
[im 89/89]
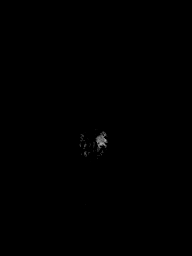

[Series 13: swi_images · axial · 1.5mm · 0.94mm/px · z∈[-65,+78]mm · 5 of 96 slices shown]
[im 1/96]
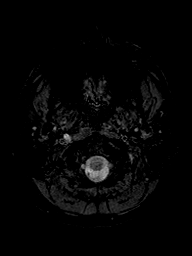
[im 24/96]
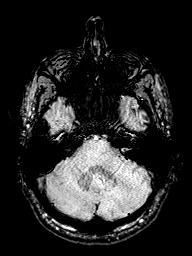
[im 48/96]
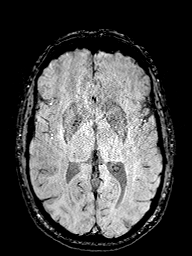
[im 72/96]
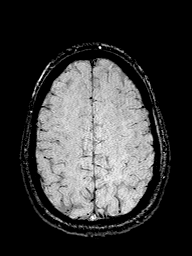
[im 96/96]
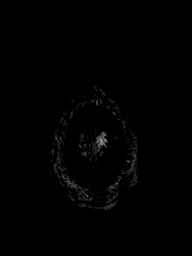

[Series 14: T1 · sagittal · 4.0mm · 0.75mm/px · 2 of 30 slices shown (2 of 3)]
[im 1/30]
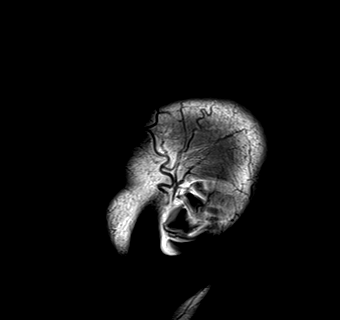
[im 30/30]
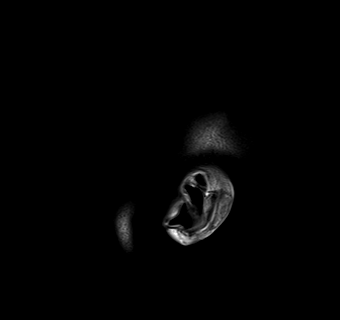

[Series 15: DWI · coronal · 5.0mm · 1.44mm/px · 4 of 71 slices shown (3 of 4)]
[im 1/71]
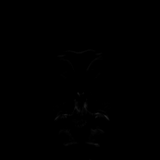
[im 24/71]
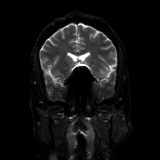
[im 47/71]
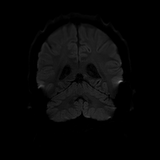
[im 71/71]
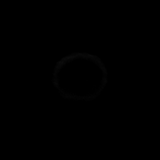

[Series 16: DWI · coronal · 5.0mm · 1.44mm/px · 2 of 35 slices shown (4 of 4)]
[im 1/35]
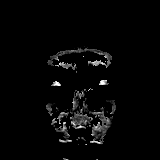
[im 35/35]
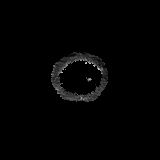

[Series 17: T2 post-contrast · coronal · 4.5mm · 0.36mm/px · 2 of 35 slices shown]
[im 1/35]
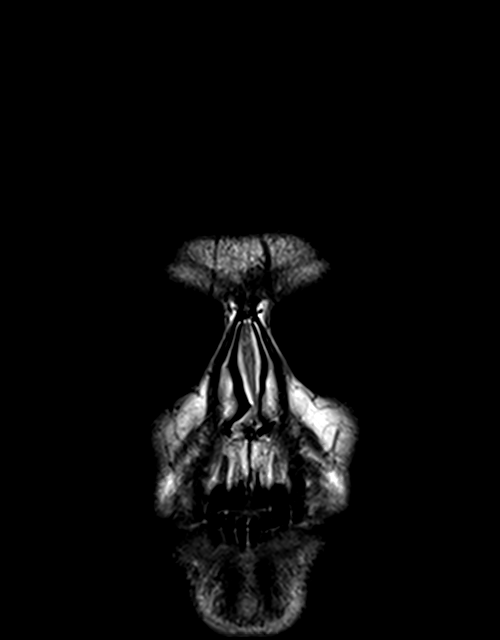
[im 35/35]
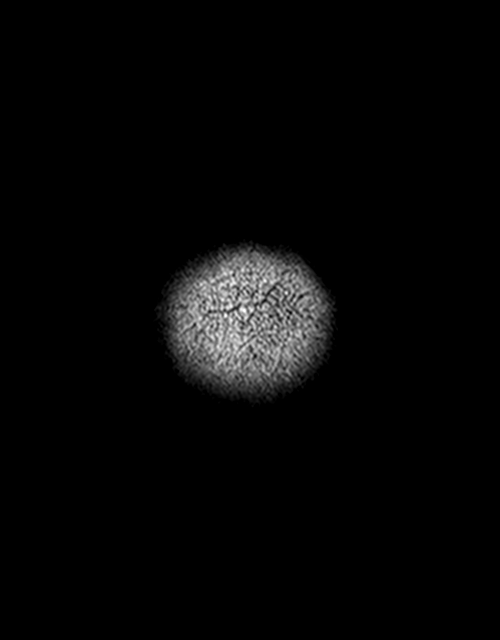

[Series 18: T1 · axial · 1.0mm · 0.94mm/px · z∈[-75,+83]mm · 8 of 160 slices shown (3 of 3)]
[im 1/160]
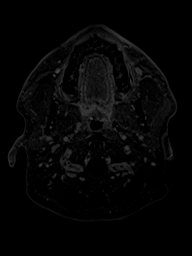
[im 23/160]
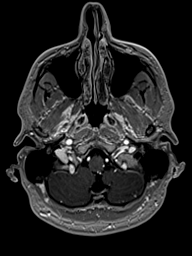
[im 46/160]
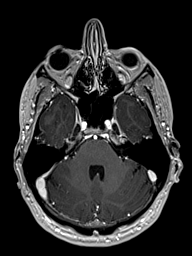
[im 69/160]
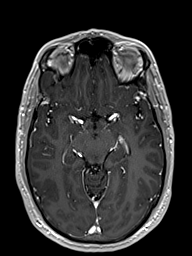
[im 91/160]
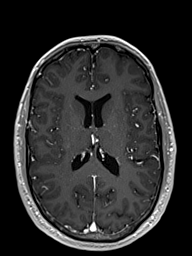
[im 114/160]
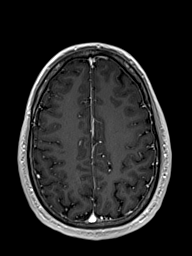
[im 137/160]
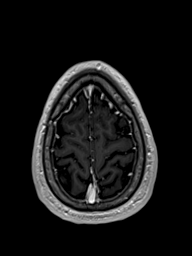
[im 160/160]
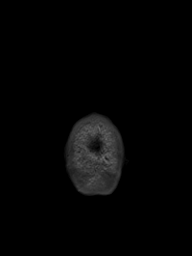

[Series 19: T1 post-contrast · coronal · 4.5mm · 0.72mm/px · 2 of 35 slices shown]
[im 1/35]
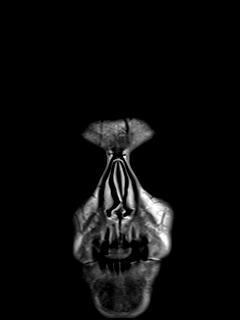
[im 35/35]
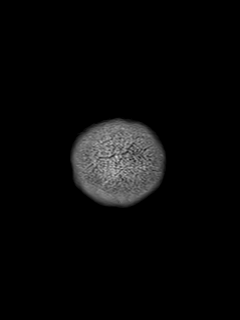

[48 of 48 positions shown; findings below may reference images not displayed]

FINDINGS: Brain: No acute infarction, hemorrhage, hydrocephalus, extra-axial
collection or mass lesion. No white matter disease or atrophy.
Symmetric normal signal in the temporal bones. Normal appearance of
the cisterns and brainstem.

Vascular: Major flow voids and vascular enhancements are preserved.

Skull and upper cervical spine: No evidence of marrow lesion.

Sinuses/Orbits: Negative
IMPRESSION: Normal brain MRI.

## 2018-09-30 DIAGNOSIS — J029 Acute pharyngitis, unspecified: Secondary | ICD-10-CM | POA: Diagnosis not present

## 2018-10-01 DIAGNOSIS — R42 Dizziness and giddiness: Secondary | ICD-10-CM | POA: Diagnosis not present

## 2018-10-01 DIAGNOSIS — R1310 Dysphagia, unspecified: Secondary | ICD-10-CM | POA: Diagnosis not present

## 2018-10-01 DIAGNOSIS — K219 Gastro-esophageal reflux disease without esophagitis: Secondary | ICD-10-CM | POA: Diagnosis not present

## 2018-10-01 DIAGNOSIS — H903 Sensorineural hearing loss, bilateral: Secondary | ICD-10-CM | POA: Diagnosis not present

## 2018-12-31 ENCOUNTER — Telehealth: Payer: Self-pay

## 2018-12-31 NOTE — Telephone Encounter (Signed)
Pt returned my call and chart has been updated.

## 2018-12-31 NOTE — Telephone Encounter (Signed)
I reached out to the pt and left a vm asking him to call me back so we could complete the pre charting for 01/01/19 visit. Consent for video visit is not on file.

## 2018-12-31 NOTE — Addendum Note (Signed)
Addended by: Verlin Grills T on: 12/31/2018 02:28 PM   Modules accepted: Orders

## 2018-12-31 NOTE — Addendum Note (Signed)
Addended by: Verlin Grills T on: 12/31/2018 02:24 PM   Modules accepted: Orders

## 2019-01-01 ENCOUNTER — Telehealth (INDEPENDENT_AMBULATORY_CARE_PROVIDER_SITE_OTHER): Payer: BC Managed Care – PPO | Admitting: Neurology

## 2019-01-01 ENCOUNTER — Encounter: Payer: Self-pay | Admitting: Neurology

## 2019-01-01 DIAGNOSIS — R42 Dizziness and giddiness: Secondary | ICD-10-CM | POA: Insufficient documentation

## 2019-01-01 NOTE — Progress Notes (Signed)
     Virtual Visit via Video Note  I connected with [name] on 01/01/19 at 11:30 AM EDT by a video enabled telemedicine application and verified that I am speaking with the correct person using two identifiers.  Location: Patient: The patient is at home. Provider: Physician is in office.   I discussed the limitations of evaluation and management by telemedicine and the availability of in person appointments. The patient expressed understanding and agreed to proceed.  History of Present Illness: Gilbert Briggs is a 48 year old right-handed white male with a history of migraine headaches.  He has had a sensation of balance he notes or slight dizziness that has occurred after he went up and down on an elevator in January 2020.  The patient does not have true vertigo, but he may feel lightheaded and unsteady with walking.  Indicates that certain activities like looking at a computer may worsen the problem.  On the weekends when he does not work, he feels more normal.  He no longer is having headaches.  He has not had any falls, he believes that over time that the bouncy feelings are gradually improving.  He was given a trial on Topamax but could not tolerate the medication.  He was given a prescription for nortriptyline but never took the drug.  He has looked up information regarding his symptoms and he has found that potentially clonazepam or Effexor may be of some benefit.   Observations/Objective: The video evaluation reveals that the patient is alert and cooperative.  Speech is well enunciated, not aphasic or dysarthric.  The patient is able to protrude the tongue midline with good lateral movement of the tongue.  The patient has good facial symmetry, full extraocular movements.  Patient has good finger-nose-finger and heel shin bilaterally.  Gait is normal.  Tandem gait is normal.  Romberg is negative.  No drift is seen.  Assessment and Plan: 1.  History of migraine headache  2.  Episodic  dizziness  The patient has continued to have some symptoms, his headaches have improved however.  The patient does not wish to go on medications currently, he will contact our office if he decides in the future to initiate a medication such as Effexor.  I suspect that some of his symptoms currently are related to a migraine epi phenomenon.  Follow Up Instructions: Follow-up as needed.   I discussed the assessment and treatment plan with the patient. The patient was provided an opportunity to ask questions and all were answered. The patient agreed with the plan and demonstrated an understanding of the instructions.   The patient was advised to call back or seek an in-person evaluation if the symptoms worsen or if the condition fails to improve as anticipated.  I provided 15 minutes of non-face-to-face time during this encounter.   Kathrynn Ducking, MD

## 2019-01-20 DIAGNOSIS — Z8249 Family history of ischemic heart disease and other diseases of the circulatory system: Secondary | ICD-10-CM | POA: Diagnosis not present

## 2019-01-20 DIAGNOSIS — E559 Vitamin D deficiency, unspecified: Secondary | ICD-10-CM | POA: Diagnosis not present

## 2019-01-20 DIAGNOSIS — Z0000 Encounter for general adult medical examination without abnormal findings: Secondary | ICD-10-CM | POA: Diagnosis not present

## 2019-01-20 DIAGNOSIS — Z79899 Other long term (current) drug therapy: Secondary | ICD-10-CM | POA: Diagnosis not present

## 2019-02-19 ENCOUNTER — Telehealth: Payer: Self-pay | Admitting: Neurology

## 2019-02-19 MED ORDER — VENLAFAXINE HCL ER 37.5 MG PO CP24: ORAL_CAPSULE | ORAL | 1 refills | Status: DC

## 2019-02-19 NOTE — Telephone Encounter (Signed)
Pt called in and stated that he wanted to start Effexor. He states it was discussed during last visit and he is agreeable

## 2019-02-19 NOTE — Telephone Encounter (Signed)
I will call in a prescription for the Effexor.

## 2019-05-20 DIAGNOSIS — R1032 Left lower quadrant pain: Secondary | ICD-10-CM | POA: Diagnosis not present

## 2019-05-20 DIAGNOSIS — Z23 Encounter for immunization: Secondary | ICD-10-CM | POA: Diagnosis not present

## 2019-07-30 ENCOUNTER — Other Ambulatory Visit: Payer: Self-pay

## 2019-07-30 ENCOUNTER — Ambulatory Visit (INDEPENDENT_AMBULATORY_CARE_PROVIDER_SITE_OTHER): Payer: 59 | Admitting: Allergy and Immunology

## 2019-07-30 ENCOUNTER — Encounter: Payer: Self-pay | Admitting: Allergy and Immunology

## 2019-07-30 VITALS — BP 142/90 | HR 64 | Temp 98.8°F | Resp 20 | Ht 70.0 in | Wt 190.9 lb

## 2019-07-30 DIAGNOSIS — H1010 Acute atopic conjunctivitis, unspecified eye: Secondary | ICD-10-CM | POA: Insufficient documentation

## 2019-07-30 DIAGNOSIS — J3089 Other allergic rhinitis: Secondary | ICD-10-CM | POA: Diagnosis not present

## 2019-07-30 DIAGNOSIS — H1013 Acute atopic conjunctivitis, bilateral: Secondary | ICD-10-CM | POA: Diagnosis not present

## 2019-07-30 MED ORDER — OLOPATADINE HCL 0.2 % OP SOLN: 1.0000 [drp] | OPHTHALMIC | 5 refills | Status: DC | PRN

## 2019-07-30 MED ORDER — LEVOCETIRIZINE DIHYDROCHLORIDE 5 MG PO TABS: ORAL_TABLET | ORAL | 5 refills | Status: DC

## 2019-07-30 MED ORDER — AZELASTINE & FLUTICASONE 137 & 50 MCG/ACT NA THPK: 1.0000 | PACK | Freq: Two times a day (BID) | NASAL | 5 refills | Status: DC

## 2019-07-30 NOTE — Progress Notes (Signed)
New Patient Note  RE: Gilbert Briggs MRN: [id number] DOB: [birth year] Date of Office Visit: 07/30/2019  Referring provider: Gaynelle Arabian, MD Primary care provider: Gaynelle Arabian, MD  Chief Complaint: Allergic Rhinitis   History of present illness: Gilbert Briggs is a 49 y.o. male seen today in consultation requested by Gaynelle Arabian, MD.   He reports that approximately 3 months ago the family brought a Restaurant manager, fast food puppy into the home and within 3 weeks the patient began to experience symptoms consistent with rhinoconjunctivitis.  He began "sneezing uncontrollably and feeling absolutely miserable."  In addition, he experienced nasal congestion, rhinorrhea, nasal pruritus, ocular pruritus, and pruritus of the ear canals.  He started taking loratadine daily with some relief.  In addition, the family sectioned the dog to a specific part of the home, he put a HEPA filter in the bedroom, and the dog was bathed once a week.  These avoidance measures seem to provide some relief of symptoms.  In the past he has experienced allergy symptoms with dust exposure.  He notes that prior to bringing this puppy in the home, they had had 2 dogs for many years which recently passed away and he did not experience symptoms around the previous 2 dogs.  Assessment and plan: Other allergic rhinitis  Aeroallergen avoidance measures have been discussed and provided in written form.  A prescription has been provided for levocetirizine(Xyzal), 5 mg daily as needed.  To avoid diminishing benefit with daily use (tachyphylaxis) of second generation antihistamine, consider alternating every few months between fexofenadine (Allegra) and levocetirizine (Xyzal).  A prescription has been provided for azelastine/fluticasone nasal spray, 1 spray per nostril twice daily as needed. Proper nasal spray technique has been discussed and demonstrated.  Nasal saline spray (i.e., Simply Saline) or nasal saline lavage (i.e.,  NeilMed) is recommended as needed and prior to medicated nasal sprays.  If allergen avoidance measures and medications fail to adequately relieve symptoms, aeroallergen immunotherapy will be considered.  Allergic conjunctivitis  Treatment plan as outlined above for allergic rhinitis.  A prescription has been provided for Pataday, one drop per eye daily as needed.  I have also recommended eye lubricant drops (i.e., Natural Tears) as needed.   Meds ordered this encounter  Medications  . levocetirizine (XYZAL) 5 MG tablet    Sig: Take one tablet once a day if needed for runny nose or itching    Dispense:  30 tablet    Refill:  5  . Azelastine & Fluticasone 137 & 50 MCG/ACT THPK    Sig: Place 1 spray into the nose 2 (two) times daily.    Dispense:  1 each    Refill:  5  . Olopatadine HCl (PATADAY) 0.2 % SOLN    Sig: Place 1 drop into both eyes as needed (itchy eyes).    Dispense:  2.5 mL    Refill:  5    Diagnostics: Epicutaneous testing: Robust reactivity to dust mite antigen.  Positive to cat. Intradermal testing: Positive to cockroach antigen.  Physical examination: Blood pressure (!) 142/90, pulse 64, temperature 98.8 F (37.1 C), temperature source Oral, resp. rate 20, height 5\' 10"  (1.778 m), weight 190 lb 14.7 oz (86.6 kg), SpO2 99 %.  General: Alert, interactive, in no acute distress. HEENT: TMs pearly gray, turbinates moderately edematous with clear discharge, post-pharynx mildly erythematous. Neck: Supple without lymphadenopathy. Lungs: Clear to auscultation without wheezing, rhonchi or rales. CV: Normal S1, S2 without murmurs. Abdomen: Nondistended, nontender. Skin: Warm and dry, without  lesions or rashes. Extremities:  No clubbing, cyanosis or edema. Neuro:   Grossly intact.  Review of systems:  Review of systems negative except as noted in HPI / PMHx or noted below: Review of Systems  Constitutional: Negative.   HENT: Negative.   Eyes: Negative.     Respiratory: Negative.   Cardiovascular: Negative.   Gastrointestinal: Negative.   Genitourinary: Negative.   Musculoskeletal: Negative.   Skin: Negative.   Neurological: Negative.   Endo/Heme/Allergies: Negative.   Psychiatric/Behavioral: Negative.     Past medical history:  Past Medical History:  Diagnosis Date  . Common migraine 08/28/2018  . Dizziness   . GERD (gastroesophageal reflux disease)     Past surgical history:  Past Surgical History:  Procedure Laterality Date  . ESOPHAGEAL DILATION      Family history: Family History  Problem Relation Age of Onset  . High blood pressure Mother   . Stroke Mother   . Breast cancer Mother   . Allergic rhinitis Mother   . Asthma Mother   . High blood pressure Father   . Heart attack Father   . High Cholesterol Father   . Allergic rhinitis Sister   . Allergic rhinitis Brother   . Angioedema Neg Hx   . Eczema Neg Hx   . Immunodeficiency Neg Hx   . Urticaria Neg Hx     Social history: Social History   Socioeconomic History  . Marital status: Married    Spouse name: Anderson Malta  . Number of children: 2  . Years of education: College  . Highest education level: Not on file  Occupational History  . Occupation: Conservation officer, historic buildings  Tobacco Use  . Smoking status: Former Research scientist (life sciences)  . Smokeless tobacco: Never Used  Substance and Sexual Activity  . Alcohol use: No  . Drug use: No  . Sexual activity: Not on file  Other Topics Concern  . Not on file  Social History Narrative   Lives with wife, Anderson Malta   Caffeine [id number]   Right handed    Social Determinants of Health   Financial Resource Strain:   . Difficulty of Paying Living Expenses: Not on file  Food Insecurity:   . Worried About Charity fundraiser in the Last Year: Not on file  . Ran Out of Food in the Last Year: Not on file  Transportation Needs:   . Lack of Transportation (Medical): Not on file  . Lack of Transportation (Non-Medical): Not on file  Physical  Activity:   . Days of Exercise per Week: Not on file  . Minutes of Exercise per Session: Not on file  Stress:   . Feeling of Stress : Not on file  Social Connections:   . Frequency of Communication with Friends and Family: Not on file  . Frequency of Social Gatherings with Friends and Family: Not on file  . Attends Religious Services: Not on file  . Active Member of Clubs or Organizations: Not on file  . Attends Archivist Meetings: Not on file  . Marital Status: Not on file  Intimate Partner Violence:   . Fear of Current or Ex-Partner: Not on file  . Emotionally Abused: Not on file  . Physically Abused: Not on file  . Sexually Abused: Not on file    Environmental History:   Gilbert Briggs lives in a 48 year old house with hardwood floors throughout, [place], and central air.  There is a dog and 2 [place] pigs in the home, none of which have access  to his bedroom.  There is no known mold/water damage in the home.  He is a former social cigarette smoker having started in January 2000 and quit in January 2005.  Current Outpatient Medications  Medication Sig Dispense Refill  . cholecalciferol (VITAMIN D) 1000 units tablet Take 2,000 Units by mouth daily.     Marland Kitchen FEVERFEW PO Take 380 mg by mouth. Takes 2 times a wk    . Magnesium 200 MG TABS Take by mouth. Takes 1 tablet 3 times per week for migraine headaches    . Multiple Vitamin (MULTI-VITAMINS) TABS Take 1 tablet by mouth daily. Centrum    . NON FORMULARY Supplementation for headaches.    . Omega-3 Fatty Acids (FISH OIL) 1000 MG CAPS Take 1 capsule by mouth daily.    Marland Kitchen omeprazole (PRILOSEC) 20 MG capsule Take 1 capsule by mouth daily.    . Azelastine & Fluticasone 137 & 50 MCG/ACT THPK Place 1 spray into the nose 2 (two) times daily. 1 each 5  . levocetirizine (XYZAL) 5 MG tablet Take one tablet once a day if needed for runny nose or itching 30 tablet 5  . Olopatadine HCl (PATADAY) 0.2 % SOLN Place 1 drop into both eyes as needed (itchy  eyes). 2.5 mL 5  . venlafaxine XR (EFFEXOR XR) 37.5 MG 24 hr capsule 1 tablet daily for 1 week, then take 2 daily (Patient not taking: Reported on 07/30/2019) 60 capsule 1   No current facility-administered medications for this visit.    Known medication allergies: No Known Allergies  I appreciate the opportunity to take part in Gilbert Briggs's care. Please do not hesitate to contact me with questions.  Sincerely,   R. Edgar Frisk, MD

## 2019-07-30 NOTE — Assessment & Plan Note (Signed)
   Aeroallergen avoidance measures have been discussed and provided in written form.  A prescription has been provided for levocetirizine(Xyzal), 5 mg daily as needed.  To avoid diminishing benefit with daily use (tachyphylaxis) of second generation antihistamine, consider alternating every few months between fexofenadine (Allegra) and levocetirizine (Xyzal).  A prescription has been provided for azelastine/fluticasone nasal spray, 1 spray per nostril twice daily as needed. Proper nasal spray technique has been discussed and demonstrated.  Nasal saline spray (i.e., Simply Saline) or nasal saline lavage (i.e., NeilMed) is recommended as needed and prior to medicated nasal sprays.  If allergen avoidance measures and medications fail to adequately relieve symptoms, aeroallergen immunotherapy will be considered.

## 2019-07-30 NOTE — Assessment & Plan Note (Signed)
   Treatment plan as outlined above for allergic rhinitis.  A prescription has been provided for Pataday, one drop per eye daily as needed.  I have also recommended eye lubricant drops (i.e., Natural Tears) as needed. 

## 2019-07-30 NOTE — Patient Instructions (Addendum)
Other allergic rhinitis  Aeroallergen avoidance measures have been discussed and provided in written form.  A prescription has been provided for levocetirizine(Xyzal), 5 mg daily as needed.  To avoid diminishing benefit with daily use (tachyphylaxis) of second generation antihistamine, consider alternating every few months between fexofenadine (Allegra) and levocetirizine (Xyzal).  A prescription has been provided for azelastine/fluticasone nasal spray, 1 spray per nostril twice daily as needed. Proper nasal spray technique has been discussed and demonstrated.  Nasal saline spray (i.e., Simply Saline) or nasal saline lavage (i.e., NeilMed) is recommended as needed and prior to medicated nasal sprays.  If allergen avoidance measures and medications fail to adequately relieve symptoms, aeroallergen immunotherapy will be considered.  Allergic conjunctivitis  Treatment plan as outlined above for allergic rhinitis.  A prescription has been provided for Pataday, one drop per eye daily as needed.  I have also recommended eye lubricant drops (i.e., Natural Tears) as needed.   Return in about 3 months (around 10/28/2019), or if symptoms worsen or fail to improve.  Control of House Dust Mite Allergen  House dust mites play a major role in allergic asthma and rhinitis.  They occur in environments with high humidity wherever human skin, the food for dust mites is found. High levels have been detected in dust obtained from mattresses, pillows, carpets, upholstered furniture, bed covers, clothes and soft toys.  The principal allergen of the house dust mite is found in its feces.  A gram of dust may contain 1,000 mites and 250,000 fecal particles.  Mite antigen is easily measured in the air during house cleaning activities.    1. Encase mattresses, including the box spring, and pillow, in an air tight cover.  Seal the zipper end of the encased mattresses with wide adhesive tape. 2. Wash the bedding in  water of 130 degrees Farenheit weekly.  Avoid cotton comforters/quilts and flannel bedding: the most ideal bed covering is the dacron comforter. 3. Remove all upholstered furniture from the bedroom. 4. Remove carpets, carpet padding, rugs, and non-washable window drapes from the bedroom.  Wash drapes weekly or use plastic window coverings. 5. Remove all non-washable stuffed toys from the bedroom.  Wash stuffed toys weekly. 6. Have the room cleaned frequently with a vacuum cleaner and a damp dust-mop.  The patient should not be in a room which is being cleaned and should wait 1 hour after cleaning before going into the room. 7. Close and seal all heating outlets in the bedroom.  Otherwise, the room will become filled with dust-laden air.  An electric heater can be used to heat the room. 8. Reduce indoor humidity to less than 50%.  Do not use a humidifier.  Control of Dog or Cat Allergen  Avoidance is the best way to manage a dog or cat allergy. If you have a dog or cat and are allergic to dog or cats, consider removing the dog or cat from the home. If you have a dog or cat but don't want to find it a new home, or if your family wants a pet even though someone in the household is allergic, here are some strategies that may help keep symptoms at bay:  1. Keep the pet out of your bedroom and restrict it to only a few rooms. Be advised that keeping the dog or cat in only one room will not limit the allergens to that room. 2. Don't pet, hug or kiss the dog or cat; if you do, wash your hands with soap  and water. 3. High-efficiency particulate air (HEPA) cleaners run continuously in a bedroom or living room can reduce allergen levels over time. 4. Place electrostatic material sheet in the air inlet vent in the bedroom. 5. Regular use of a high-efficiency vacuum cleaner or a central vacuum can reduce allergen levels. 6. Giving your dog or cat a bath at least once a week can reduce airborne  allergen.  Control of Cockroach Allergen  Cockroach allergen has been identified as an important cause of acute attacks of asthma, especially in urban settings.  There are fifty-five species of cockroach that exist in the [place], however only three, the [place], [place] species produce allergen that can affect patients with Asthma.  Allergens can be obtained from fecal particles, egg casings and secretions from cockroaches.    1. Remove food sources. 2. Reduce access to water. 3. Seal access and entry points. 4. Spray runways with 0.5-1% Diazinon or Chlorpyrifos 5. Blow boric acid power under stoves and refrigerator. 6. Place bait stations (hydramethylnon) at feeding sites.

## 2019-08-05 ENCOUNTER — Encounter: Payer: Self-pay | Admitting: Neurology

## 2019-08-05 ENCOUNTER — Ambulatory Visit: Payer: 59 | Admitting: Neurology

## 2019-08-05 ENCOUNTER — Other Ambulatory Visit: Payer: Self-pay

## 2019-08-05 VITALS — BP 141/71 | HR 74 | Temp 97.7°F | Ht 70.0 in | Wt 193.0 lb

## 2019-08-05 DIAGNOSIS — G43009 Migraine without aura, not intractable, without status migrainosus: Secondary | ICD-10-CM

## 2019-08-05 DIAGNOSIS — R42 Dizziness and giddiness: Secondary | ICD-10-CM | POA: Diagnosis not present

## 2019-08-05 NOTE — Progress Notes (Signed)
Reason for visit: Dizziness, headache  Gilbert Briggs is an 49 y.o. male  History of present illness:  Gilbert Briggs is a 49 year old right-handed white male with a history of a sensation of imbalance or false movement that occurred after an elevator suddenly dropped while he was in it.  The patient has had some occasional headaches, the possibility of migraine was entertained.  The patient was placed on Topamax but could not tolerate it, he was given Effexor and nortriptyline but never took these medications.  The patient indicates that his sensations generally occur when he is working, he does not have symptoms on weekends.  If he is looking at a computer for long periods of time he has increased problems.  He will try to get up frequently and look around and not focus on the computer, this does help his symptoms.  He believes that he has improved gradually over time.  He is no longer having headaches.  Over the last 1 to 2 months he has had some events coming out of sleep when he wakes up he will have a tingling sensation on the hands or in the back of the neck, if he moves the sensations go away.  He is on magnesium for leg cramps at night which seems to help some.  He believes that the sensation of dizziness is no longer affecting his ability to function.  He denies any balance issues.  Past Medical History:  Diagnosis Date  . Common migraine 08/28/2018  . Dizziness   . GERD (gastroesophageal reflux disease)     Past Surgical History:  Procedure Laterality Date  . ESOPHAGEAL DILATION      Family History  Problem Relation Age of Onset  . High blood pressure Mother   . Stroke Mother   . Breast cancer Mother   . Allergic rhinitis Mother   . Asthma Mother   . High blood pressure Father   . Heart attack Father   . High Cholesterol Father   . Allergic rhinitis Sister   . Allergic rhinitis Brother   . Angioedema Neg Hx   . Eczema Neg Hx   . Immunodeficiency Neg Hx   . Urticaria Neg  Hx     Social history:  reports that he has quit smoking. He has never used smokeless tobacco. He reports that he does not drink alcohol or use drugs.   No Known Allergies  Medications:  Prior to Admission medications   Medication Sig Start Date End Date Taking? Authorizing Provider  Azelastine & Fluticasone 137 & 50 MCG/ACT THPK Place 1 spray into the nose 2 (two) times daily. 07/30/19  Yes Bobbitt, Sedalia Muta, MD  cholecalciferol (VITAMIN D) 1000 units tablet Take 2,000 Units by mouth daily.    Yes [provider]  FEVERFEW PO Take 380 mg by mouth. Takes 2 times a wk   Yes [provider]  levocetirizine (XYZAL) 5 MG tablet Take one tablet once a day if needed for runny nose or itching 07/30/19  Yes Bobbitt, Sedalia Muta, MD  Magnesium 200 MG TABS Take by mouth. Takes 1 tablet 3 times per week for migraine headaches   Yes [provider]  Multiple Vitamin (MULTI-VITAMINS) TABS Take 1 tablet by mouth daily. Centrum   Yes [provider]  NON FORMULARY Supplementation for headaches.   Yes [provider]  Olopatadine HCl (PATADAY) 0.2 % SOLN Place 1 drop into both eyes as needed (itchy eyes). 07/30/19  Yes Bobbitt, Deidre Ala  Eulas Post, MD  Omega-3 Fatty Acids (FISH OIL) 1000 MG CAPS Take 1 capsule by mouth daily.   Yes [provider]  omeprazole (PRILOSEC) 20 MG capsule Take 1 capsule by mouth daily. 03/14/17  Yes [provider]    ROS:  Out of a complete 14 system review of symptoms, the patient complains only of the following symptoms, and all other reviewed systems are negative.  Dizziness Leg cramps  Blood pressure (!) 141/71, pulse 74, temperature 97.7 F (36.5 C), height 5\' 10"  (1.778 m), weight 193 lb (87.5 kg).  Physical Exam  General: The patient is alert and cooperative at the time of the examination.  Skin: No significant peripheral edema is noted.   Neurologic Exam  Mental status: The patient is alert and  oriented x 3 at the time of the examination. The patient has apparent normal recent and remote memory, with an apparently normal attention span and concentration ability.   Cranial nerves: Facial symmetry is present. Speech is normal, no aphasia or dysarthria is noted. Extraocular movements are full. Visual fields are full.  Motor: The patient has good strength in all 4 extremities.  Sensory examination: Soft touch sensation is symmetric on the face, arms, and legs.  Coordination: The patient has good finger-nose-finger and heel-to-shin bilaterally.  Gait and station: The patient has a normal gait. Tandem gait is normal. Romberg is negative. No drift is seen.  Reflexes: Deep tendon reflexes are symmetric.   Assessment/Plan:  1.  Dizziness  2.  Headache  The patient is having some altered sensation when he first wakes up is a parasomnia that is benign, no treatment is needed.  The patient is doing better with his dizziness, we will watch conservatively, he will contact our office if he has any worsening symptoms but he appears to be clearly improved.  He has not wanted to go on any medical therapy.  Greater than 50% of the visit was spent in counseling and coordination of care.  Face-to-face time with the patient was 20 minutes.   Jill Alexanders MD 08/05/2019 7:18 AM  [facility] [address] [place], [place] [address]  Phone [phone] Fax [phone]

## 2019-10-30 ENCOUNTER — Ambulatory Visit: Payer: 59 | Admitting: Allergy and Immunology

## 2020-07-20 ENCOUNTER — Encounter: Payer: Self-pay | Admitting: Neurology

## 2020-07-20 ENCOUNTER — Other Ambulatory Visit: Payer: Self-pay

## 2020-07-20 ENCOUNTER — Ambulatory Visit: Payer: 59 | Admitting: Neurology

## 2020-07-20 ENCOUNTER — Telehealth: Payer: Self-pay

## 2020-07-20 VITALS — BP 140/76 | HR 62 | Ht 71.0 in | Wt 183.4 lb

## 2020-07-20 DIAGNOSIS — R42 Dizziness and giddiness: Secondary | ICD-10-CM | POA: Diagnosis not present

## 2020-07-20 MED ORDER — VENLAFAXINE HCL ER 37.5 MG PO CP24: ORAL_CAPSULE | ORAL | 2 refills | Status: DC

## 2020-07-20 NOTE — Patient Instructions (Signed)
We will start effexor for the dizziness.

## 2020-07-20 NOTE — Telephone Encounter (Signed)
Received a PA request, completed and submitted on CMM, Key: [id number] - Rx #: [id number]  Your PA has been resolved, no additional PA is required. For further inquiries please contact the number on the back of the member prescription card.

## 2020-07-20 NOTE — Progress Notes (Signed)
Reason for visit: Dizziness  Gilbert Briggs is an 49 y.o. male  History of present illness:  Gilbert Briggs is a 50 year old right-handed white male with a history of sensations of movement that have occurred in the past.  He seemed to improve since last seen in January 2021.  By the summer he felt almost back to normal and began vigorous exercise training in October 2021.  After 6 weeks of this, he began having worsening of his symptoms.  He once again started having mild headaches that are retro-orbital in nature and are occurring about 3 times a week.  The dizziness sensations are not specifically related to the headaches.  He also reports some neck stiffness and discomfort in the shoulders.  He initially noted the sensation of movement or rocking sensations while lying down, but now they are occurring when he is up and active, he may note symptoms when he changes shoes or goes from carpet to hardwood floor.  He has not had any falls.  He reports no nausea or vomiting or photophobia or phonophobia.  Has had MRI of the brain done in the past that was normal.  The episodes are now occurring daily in nature, he believes that they are as severe as they were when it initially started.  He denies any visual complaints.  He returns to the office today for an evaluation.  Previously, he was given Topamax but could not tolerate the drug.  He was given nortriptyline and Effexor but he never took the medication.  He reports that working on the computer, stress, and long distance travel may worsen his symptoms.  He generally feels better outside, he reports that he does have some allergy problems to dust.  Past Medical History:  Diagnosis Date  . Common migraine 08/28/2018  . Dizziness   . GERD (gastroesophageal reflux disease)     Past Surgical History:  Procedure Laterality Date  . ESOPHAGEAL DILATION      Family History  Problem Relation Age of Onset  . High blood pressure Mother   . Stroke Mother    . Breast cancer Mother   . Allergic rhinitis Mother   . Asthma Mother   . High blood pressure Father   . Heart attack Father   . High Cholesterol Father   . Allergic rhinitis Sister   . Allergic rhinitis Brother   . Angioedema Neg Hx   . Eczema Neg Hx   . Immunodeficiency Neg Hx   . Urticaria Neg Hx     Social history:  reports that he has quit smoking. He has never used smokeless tobacco. He reports that he does not drink alcohol and does not use drugs.    Allergies  Allergen Reactions  . Topamax [Topiramate]     anxiety    Medications:  Prior to Admission medications   Medication Sig Start Date End Date Taking? Authorizing Provider  ASHWAGANDHA PO Take 350 mg by mouth.   Yes [provider]  cholecalciferol (VITAMIN D) 1000 units tablet Take 2,000 Units by mouth daily.    Yes [provider]  co-enzyme Q-10 30 MG capsule Take 30 mg by mouth 3 (three) times daily.   Yes [provider]  FEVERFEW PO Take 380 mg by mouth. Takes 2 times a wk   Yes [provider]  Magnesium 200 MG TABS Take by mouth. Takes 1 tablet 3 times per week for migraine headaches   Yes [provider]  Multiple Vitamin (  MULTI-VITAMINS) TABS Take 1 tablet by mouth daily. Centrum   Yes [provider]  Olopatadine HCl (PATADAY) 0.2 % SOLN Place 1 drop into both eyes as needed (itchy eyes). 07/30/19  Yes Bobbitt, Heywood Iles, MD  omega-3 acid ethyl esters (LOVAZA) 1 g capsule Take by mouth 2 (two) times daily.   Yes [provider]  Omega-3 Fatty Acids (FISH OIL) 1000 MG CAPS Take 1 capsule by mouth daily.   Yes [provider]  omeprazole (PRILOSEC) 20 MG capsule Take 1 capsule by mouth daily. 03/14/17  Yes [provider]  thiamine (VITAMIN B-1) 100 MG tablet Take 100 mg by mouth daily.   Yes [provider]  Azelastine & Fluticasone 137 & 50 MCG/ACT THPK Place 1 spray into the nose 2 (two) times daily. 07/30/19    Bobbitt, Heywood Iles, MD  levocetirizine Elita Boone) 5 MG tablet Take one tablet once a day if needed for runny nose or itching 07/30/19   Bobbitt, Heywood Iles, MD  NON FORMULARY Supplementation for headaches.    [provider]    ROS:  Out of a complete 14 system review of symptoms, the patient complains only of the following symptoms, and all other reviewed systems are negative.  Dizzy sensations Headache  Blood pressure 140/76, pulse 62, height 5\' 11"  (1.803 m), weight 183 lb 6.4 oz (83.2 kg).  Physical Exam  General: The patient is alert and cooperative at the time of the examination.  Skin: No significant peripheral edema is noted.   Neurologic Exam  Mental status: The patient is alert and oriented x 3 at the time of the examination. The patient has apparent normal recent and remote memory, with an apparently normal attention span and concentration ability.   Cranial nerves: Facial symmetry is present. Speech is normal, no aphasia or dysarthria is noted. Extraocular movements are full. Visual fields are full.  Motor: The patient has good strength in all 4 extremities.  Sensory examination: Soft touch sensation is symmetric on the face, arms, and legs.  Coordination: The patient has good finger-nose-finger and heel-to-shin bilaterally.  Gait and station: The patient has a normal gait. Tandem gait is normal. Romberg is negative. No drift is seen.  Reflexes: Deep tendon reflexes are symmetric.   Assessment/Plan:  1.  Subjective dizziness, sensation of motion  2.  Headache  The headache is not severe but clearly appears to be associated with the onset of the symptoms.  The headaches went away when his other symptoms went away.  He will be placed on Effexor taking 37.5 mg daily for a week and then go to 75 mg daily.  He will call for any dose adjustments.  If this is not effective, we may try low-dose diazepam or a beta-blocker in the future.  There is a  possibility that the symptoms may be related to migraine or possibly to his neck stiffness issue or allergies.  He will follow up here in 4 months.  MD 07/20/2020 10:08 AM  [facility] [address] [place], [place]  Phone [phone] Fax [phone]

## 2020-07-20 NOTE — Telephone Encounter (Signed)
Patient called and didn't understand why his prescription was denied at the pharmacy, explained that we had filed a PA for the medication on his behalf to his insurance company and would need up to 48 hours before a determination was given.  He verbalized understanding.

## 2020-09-02 ENCOUNTER — Ambulatory Visit: Payer: 59 | Admitting: Neurology

## 2020-10-18 ENCOUNTER — Other Ambulatory Visit: Payer: Self-pay | Admitting: Neurology

## 2020-10-20 ENCOUNTER — Other Ambulatory Visit: Payer: Self-pay | Admitting: *Deleted

## 2020-10-20 MED ORDER — VENLAFAXINE HCL ER 37.5 MG PO CP24: ORAL_CAPSULE | ORAL | 2 refills | Status: DC

## 2020-10-25 ENCOUNTER — Telehealth: Payer: Self-pay | Admitting: Neurology

## 2020-10-25 MED ORDER — VENLAFAXINE HCL ER 75 MG PO CP24: 75.0000 mg | ORAL_CAPSULE | Freq: Every day | ORAL | 1 refills | Status: DC

## 2020-10-25 NOTE — Telephone Encounter (Signed)
Patient called back and verified insurance information.   Called and spoke to Gilbert Briggs in [place] does not cover 2 pills per day.   Sending message to Dr. Jannifer Franklin if he wants to change to 75 mg tablets and patient can take 1/2 tablet twice a day.  That will be covered.

## 2020-10-25 NOTE — Telephone Encounter (Signed)
Pt called, insurance suggested the physician back date the prescription; so that I can get reimbursed for money I paid out pocket on 4/8, to get medication to last me through the weekend. Would like a call from the nurse.

## 2020-10-25 NOTE — Telephone Encounter (Signed)
Called [facility], was informed they are still showing they need a PA on their end because [employer] only covers 1 pill per day and he is taking 2 pills per day.  The PA the was done on CMM shows for 60 tablets for 30 day supply.  Called [facility] for authorization information. Spoke to [facility] regarding claim.  I was informed that the insurance information we have on file (07/21/20) is in active.  Called patient, left message for patient to return my call on voicemail.

## 2020-10-25 NOTE — Telephone Encounter (Signed)
Pt called wanting to make sure that the ins information is correct so that he can get his medication filled in today.  Member ID# [id number] RX# [id number] Group# [id number]

## 2020-10-25 NOTE — Telephone Encounter (Signed)
I will change the prescription to indicate 75 mg once daily.  I called the patient and left a message.

## 2020-10-25 NOTE — Telephone Encounter (Signed)
2nd PA for Effexor done on [facility] 10/20/2020 Key [id number] Awaiting determination from [facility]  Received this response:   Your PA has been resolved, no additional PA is required. For further inquiries please contact the number on the back of the member prescription card. (Message [phone])

## 2020-10-25 NOTE — Addendum Note (Signed)
Addended by: Kathrynn Ducking on: 10/25/2020 04:56 PM   Modules accepted: Orders

## 2020-11-23 ENCOUNTER — Encounter: Payer: Self-pay | Admitting: Neurology

## 2020-11-23 ENCOUNTER — Ambulatory Visit: Payer: 59 | Admitting: Neurology

## 2020-11-23 VITALS — BP 124/84 | HR 67 | Ht 71.0 in | Wt 188.0 lb

## 2020-11-23 DIAGNOSIS — R42 Dizziness and giddiness: Secondary | ICD-10-CM | POA: Diagnosis not present

## 2020-11-23 DIAGNOSIS — G43009 Migraine without aura, not intractable, without status migrainosus: Secondary | ICD-10-CM

## 2020-11-23 NOTE — Progress Notes (Signed)
Reason for visit: Dizziness, headache  Gilbert Briggs is an 50 y.o. male  History of present illness:  Gilbert Briggs is a 50 year old right-handed white male with a history of dizziness and headache.  The patient has been placed on Effexor for presumed migraine events, he has done quite well on this medication.  Previously, he did not tolerate the Topamax.  The patient is on 75 mg daily, he is virtually symptom-free with exception that he still has some sensations of false movement when he drives for long distances and then tries to get out and walk.  The patient is able to work on the computer without difficulty, he tries to avoid using elevators as much as possible.  Overall, he is quite satisfied with his current level of functioning.  He is not having any side effects off of the Effexor.  Past Medical History:  Diagnosis Date  . Common migraine 08/28/2018  . Dizziness   . GERD (gastroesophageal reflux disease)     Past Surgical History:  Procedure Laterality Date  . ESOPHAGEAL DILATION      Family History  Problem Relation Age of Onset  . High blood pressure Mother   . Stroke Mother   . Breast cancer Mother   . Allergic rhinitis Mother   . Asthma Mother   . High blood pressure Father   . Heart attack Father   . High Cholesterol Father   . Allergic rhinitis Sister   . Allergic rhinitis Brother   . Angioedema Neg Hx   . Eczema Neg Hx   . Immunodeficiency Neg Hx   . Urticaria Neg Hx     Social history:  reports that he has quit smoking. He has never used smokeless tobacco. He reports that he does not drink alcohol and does not use drugs.    Allergies  Allergen Reactions  . Topamax [Topiramate]     anxiety    Medications:  Prior to Admission medications   Medication Sig Start Date End Date Taking? Authorizing Provider  ASHWAGANDHA PO Take 350 mg by mouth.   Yes [provider]  cholecalciferol (VITAMIN D) 1000 units tablet Take 2,000 Units by mouth daily.     Yes [provider]  co-enzyme Q-10 30 MG capsule Take 30 mg by mouth 3 (three) times daily.   Yes [provider]  FEVERFEW PO Take 380 mg by mouth. Takes 2 times a wk   Yes [provider]  Magnesium 200 MG TABS Take by mouth. Takes 1 tablet 3 times per week for migraine headaches   Yes [provider]  Multiple Vitamin (MULTI-VITAMINS) TABS Take 1 tablet by mouth daily. Centrum   Yes [provider]  Olopatadine HCl (PATADAY) 0.2 % SOLN Place 1 drop into both eyes as needed (itchy eyes). 07/30/19  Yes Bobbitt, Sedalia Muta, MD  omega-3 acid ethyl esters (LOVAZA) 1 g capsule Take by mouth 2 (two) times daily.   Yes [provider]  Omega-3 Fatty Acids (FISH OIL) 1000 MG CAPS Take 1 capsule by mouth daily.   Yes [provider]  omeprazole (PRILOSEC) 20 MG capsule Take 1 capsule by mouth daily. 03/14/17  Yes [provider]  thiamine (VITAMIN B-1) 100 MG tablet Take 100 mg by mouth daily.   Yes [provider]  venlafaxine XR (EFFEXOR XR) 75 MG 24 hr capsule Take 1 capsule (75 mg total) by mouth daily with breakfast. 10/25/20  Yes Kathrynn Ducking, MD  ROS:  Out of a complete 14 system review of symptoms, the patient complains only of the following symptoms, and all other reviewed systems are negative.  Dizziness Headache  Blood pressure 124/84, pulse 67, height 5\' 11"  (1.803 m), weight 188 lb (85.3 kg).  Physical Exam  General: The patient is alert and cooperative at the time of the examination.  Skin: No significant peripheral edema is noted.   Neurologic Exam  Mental status: The patient is alert and oriented x 3 at the time of the examination. The patient has apparent normal recent and remote memory, with an apparently normal attention span and concentration ability.   Cranial nerves: Facial symmetry is present. Speech is normal, no aphasia or dysarthria is noted. Extraocular movements are full.  Visual fields are full.  Motor: The patient has good strength in all 4 extremities.  Sensory examination: Soft touch sensation is symmetric on the face, arms, and legs.  Coordination: The patient has good finger-nose-finger and heel-to-shin bilaterally.  Gait and station: The patient has a normal gait. Tandem gait is normal. Romberg is negative. No drift is seen.  Reflexes: Deep tendon reflexes are symmetric.   Assessment/Plan:  1.  Episodic dizziness, headache  The patient likely has a migraine syndrome.  The Effexor seems to help this, we will continue the medication for now.  If the patient is symptom-free for more than 6 months, we may consider a slow taper off of Effexor.  The patient will follow up in 6 months.  Jill Alexanders MD 11/23/2020 9:31 AM  [facility] [address] [place],  [address]  Phone [phone] Fax [phone]

## 2021-02-28 DIAGNOSIS — Z125 Encounter for screening for malignant neoplasm of prostate: Secondary | ICD-10-CM | POA: Diagnosis not present

## 2021-02-28 DIAGNOSIS — Z79899 Other long term (current) drug therapy: Secondary | ICD-10-CM | POA: Diagnosis not present

## 2021-02-28 DIAGNOSIS — Z13220 Encounter for screening for lipoid disorders: Secondary | ICD-10-CM | POA: Diagnosis not present

## 2021-02-28 DIAGNOSIS — Z0000 Encounter for general adult medical examination without abnormal findings: Secondary | ICD-10-CM | POA: Diagnosis not present

## 2021-03-10 DIAGNOSIS — M25562 Pain in left knee: Secondary | ICD-10-CM | POA: Diagnosis not present

## 2021-04-18 ENCOUNTER — Other Ambulatory Visit: Payer: Self-pay | Admitting: *Deleted

## 2021-04-18 MED ORDER — VENLAFAXINE HCL ER 75 MG PO CP24: 75.0000 mg | ORAL_CAPSULE | Freq: Every day | ORAL | 1 refills | Status: DC

## 2021-04-19 DIAGNOSIS — M25562 Pain in left knee: Secondary | ICD-10-CM | POA: Diagnosis not present

## 2021-05-04 DIAGNOSIS — M6281 Muscle weakness (generalized): Secondary | ICD-10-CM | POA: Diagnosis not present

## 2021-05-04 DIAGNOSIS — M25562 Pain in left knee: Secondary | ICD-10-CM | POA: Diagnosis not present

## 2021-05-11 DIAGNOSIS — M6281 Muscle weakness (generalized): Secondary | ICD-10-CM | POA: Diagnosis not present

## 2021-05-11 DIAGNOSIS — M25562 Pain in left knee: Secondary | ICD-10-CM | POA: Diagnosis not present

## 2021-05-18 DIAGNOSIS — M25562 Pain in left knee: Secondary | ICD-10-CM | POA: Diagnosis not present

## 2021-05-18 DIAGNOSIS — M6281 Muscle weakness (generalized): Secondary | ICD-10-CM | POA: Diagnosis not present

## 2021-05-25 DIAGNOSIS — M25562 Pain in left knee: Secondary | ICD-10-CM | POA: Diagnosis not present

## 2021-05-25 DIAGNOSIS — M6281 Muscle weakness (generalized): Secondary | ICD-10-CM | POA: Diagnosis not present

## 2021-06-01 DIAGNOSIS — M6281 Muscle weakness (generalized): Secondary | ICD-10-CM | POA: Diagnosis not present

## 2021-06-01 DIAGNOSIS — M25562 Pain in left knee: Secondary | ICD-10-CM | POA: Diagnosis not present

## 2021-06-13 ENCOUNTER — Ambulatory Visit: Payer: 59 | Admitting: Neurology

## 2021-06-15 DIAGNOSIS — M25562 Pain in left knee: Secondary | ICD-10-CM | POA: Diagnosis not present

## 2021-06-15 DIAGNOSIS — M6281 Muscle weakness (generalized): Secondary | ICD-10-CM | POA: Diagnosis not present

## 2021-07-12 NOTE — Progress Notes (Signed)
PATIENT: Gilbert Briggs DOB: [birth year]  REASON FOR VISIT: Follow up for dizziness, headache  HISTORY FROM: Patient PRIMARY NEUROLOGIST: Dr. Jannifer Franklin, now can be followed by Dr. Billey Gosling   HISTORY OF PRESENT ILLNESS: Today 07/13/21 Gilbert Briggs here today for follow-up with history of dizziness and headaches. Doing well on Effexor-XR 75 mg daily. Still has some triggers of motion (car rides, prolonged computer use) for dizzy symptoms. Since taking the Effexor, night and day difference. Right now doesn't want to start the taper. Virtually zero side effects. Sees PCP.   HISTORY 11/23/2020 Dr. Jannifer Franklin: Gilbert Briggs is a 50 year old right-handed white male with a history of dizziness and headache.  The patient has been placed on Effexor for presumed migraine events, he has done quite well on this medication.  Previously, he did not tolerate the Topamax.  The patient is on 75 mg daily, he is virtually symptom-free with exception that he still has some sensations of false movement when he drives for long distances and then tries to get out and walk.  The patient is able to work on the computer without difficulty, he tries to avoid using elevators as much as possible.  Overall, he is quite satisfied with his current level of functioning.  He is not having any side effects off of the Effexor.   REVIEW OF SYSTEMS: Out of a complete 14 system review of symptoms, the patient complains only of the following symptoms, and all other reviewed systems are negative.  See HPI  ALLERGIES: Allergies  Allergen Reactions   Topamax [Topiramate]     anxiety    HOME MEDICATIONS: Outpatient Medications Prior to Visit  Medication Sig Dispense Refill   ASHWAGANDHA PO Take 350 mg by mouth.     cholecalciferol (VITAMIN D) 1000 units tablet Take 2,000 Units by mouth daily.      FEVERFEW PO Take 380 mg by mouth. Takes 2 times a wk     fexofenadine (ALLEGRA) 180 MG tablet Take 180 mg by mouth as needed for allergies or  rhinitis.     Magnesium 200 MG TABS Take by mouth. Takes 1 tablet 3 times per week for migraine headaches     Multiple Vitamin (MULTI-VITAMINS) TABS Take 1 tablet by mouth daily. Centrum     Olopatadine HCl (PATADAY) 0.2 % SOLN Place 1 drop into both eyes as needed (itchy eyes). 2.5 mL 5   omega-3 acid ethyl esters (LOVAZA) 1 g capsule Take by mouth 2 (two) times daily.     Omega-3 Fatty Acids (FISH OIL) 1000 MG CAPS Take 1 capsule by mouth daily.     omeprazole (PRILOSEC) 20 MG capsule Take 1 capsule by mouth daily.     thiamine (VITAMIN B-1) 100 MG tablet Take 100 mg by mouth daily.     venlafaxine XR (EFFEXOR XR) 75 MG 24 hr capsule Take 1 capsule (75 mg total) by mouth daily with breakfast. 90 capsule 1   co-enzyme Q-10 30 MG capsule Take 30 mg by mouth 3 (three) times daily.     No facility-administered medications prior to visit.    PAST MEDICAL HISTORY: Past Medical History:  Diagnosis Date   Common migraine 08/28/2018   Dizziness    GERD (gastroesophageal reflux disease)     PAST SURGICAL HISTORY: Past Surgical History:  Procedure Laterality Date   ESOPHAGEAL DILATION      FAMILY HISTORY: Family History  Problem Relation Age of Onset   High blood pressure Mother    Stroke Mother  Breast cancer Mother    Allergic rhinitis Mother    Asthma Mother    High blood pressure Father    Heart attack Father    High Cholesterol Father    Allergic rhinitis Sister    Allergic rhinitis Brother    Angioedema Neg Hx    Eczema Neg Hx    Immunodeficiency Neg Hx    Urticaria Neg Hx     SOCIAL HISTORY: Social History   Socioeconomic History   Marital status: Married    Spouse name: Anderson Malta   Number of children: 2   Years of education: College   Highest education level: Not on file  Occupational History   Occupation: Conservation officer, historic buildings    Comment: full time  Tobacco Use   Smoking status: Former   Smokeless tobacco: Never  Scientific laboratory technician Use: Never used  Substance  and Sexual Activity   Alcohol use: No   Drug use: No   Sexual activity: Not on file  Other Topics Concern   Not on file  Social History Narrative   Lives with wife, Anderson Malta and 2 daughters,   Caffeine 1-2 cups daily   Right handed    Social Determinants of Health   Financial Resource Strain: Not on file  Food Insecurity: Not on file  Transportation Needs: Not on file  Physical Activity: Not on file  Stress: Not on file  Social Connections: Not on file  Intimate Partner Violence: Not on file   PHYSICAL EXAM  Vitals:   07/13/21 0747  BP: (!) 144/88  Pulse: (!) 58  Weight: 187 lb (84.8 kg)  Height: 5\' 11"  (1.803 m)   Body mass index is 26.08 kg/m.  Generalized: Well developed, in no acute distress   Neurological examination  Mentation: Alert oriented to time, place, history taking. Follows all commands speech and language fluent Cranial nerve II-XII: Pupils were equal round reactive to light. Extraocular movements were full, visual field were full on confrontational test. Facial sensation and strength were normal. Head turning and shoulder shrug  were normal and symmetric. Motor: The motor testing reveals 5 over 5 strength of all 4 extremities. Good symmetric motor tone is noted throughout.  Sensory: Sensory testing is intact to soft touch on all 4 extremities. No evidence of extinction is noted.  Coordination: Cerebellar testing reveals good finger-nose-finger and heel-to-shin bilaterally.  Gait and station: Gait is normal. Tandem gait is normal.   DIAGNOSTIC DATA (LABS, IMAGING, TESTING) - I reviewed patient records, labs, notes, testing and imaging myself where available.  No results found for: WBC, HGB, HCT, MCV, PLT No results found for: NA, K, CL, CO2, GLUCOSE, BUN, CREATININE, CALCIUM, PROT, ALBUMIN, AST, ALT, ALKPHOS, BILITOT, GFRNONAA, GFRAA No results found for: CHOL, HDL, LDLCALC, LDLDIRECT, TRIG, CHOLHDL No results found for: HGBA1C No results found for:  VITAMINB12 No results found for: TSH  ASSESSMENT AND PLAN 50 y.o. year old male  has a past medical history of Common migraine (08/28/2018), Dizziness, and GERD (gastroesophageal reflux disease). here with:  1.  Episodic dizziness, headache, likely migraine syndrome  -Kristain continues to have excellent benefit from Effexor -We will continue Effexor-XR 75 mg daily at current dose for now, he wishes to hold off on taper  -He will follow-up here in 1 year or sooner if needed, if he continues to do well, can follow with his PCP, he will be followed by Dr. Billey Gosling since Dr. Jannifer Franklin has now retired  Butler Denmark, AGNP-C, [facility] 07/13/2021, 8:00  AM [facility] [address], [place], [place] [address] [phone]

## 2021-07-13 ENCOUNTER — Ambulatory Visit (INDEPENDENT_AMBULATORY_CARE_PROVIDER_SITE_OTHER): Payer: BC Managed Care – PPO | Admitting: Neurology

## 2021-07-13 ENCOUNTER — Encounter: Payer: Self-pay | Admitting: Neurology

## 2021-07-13 ENCOUNTER — Other Ambulatory Visit: Payer: Self-pay

## 2021-07-13 VITALS — BP 144/88 | HR 58 | Ht 71.0 in | Wt 187.0 lb

## 2021-07-13 DIAGNOSIS — G43009 Migraine without aura, not intractable, without status migrainosus: Secondary | ICD-10-CM

## 2021-07-13 DIAGNOSIS — R42 Dizziness and giddiness: Secondary | ICD-10-CM

## 2021-07-13 MED ORDER — VENLAFAXINE HCL ER 75 MG PO CP24: 75.0000 mg | ORAL_CAPSULE | Freq: Every day | ORAL | 3 refills | Status: DC

## 2021-07-13 NOTE — Patient Instructions (Signed)
Great to see you today! Continue taking Effexor-XR at current dosing See you back in 1 year

## 2021-07-27 DIAGNOSIS — M6281 Muscle weakness (generalized): Secondary | ICD-10-CM | POA: Diagnosis not present

## 2021-07-27 DIAGNOSIS — M25562 Pain in left knee: Secondary | ICD-10-CM | POA: Diagnosis not present

## 2021-09-15 DIAGNOSIS — M7652 Patellar tendinitis, left knee: Secondary | ICD-10-CM | POA: Diagnosis not present

## 2021-09-28 ENCOUNTER — Encounter: Payer: Self-pay | Admitting: Family Medicine

## 2021-09-28 ENCOUNTER — Ambulatory Visit: Payer: Self-pay | Admitting: Family Medicine

## 2021-09-28 VITALS — BP 122/84 | Ht 71.0 in | Wt 185.0 lb

## 2021-09-28 DIAGNOSIS — M7652 Patellar tendinitis, left knee: Secondary | ICD-10-CM

## 2021-09-28 NOTE — Progress Notes (Signed)
Patient comes in today as a referral from Dr. Sheppard Coil for shockwave therapy left patellar tendon.  He's struggled with patellar tendinopathy for about 2 years.  Has done physical therapy, home exercises, activity modification.  Did not tolerate nitro patches. ? ?Procedure: ECSWT ?Indications:  left patellar tendinopathy ?  ?Procedure Details ?Consent: Risks of procedure as well as the alternatives and risks of each were explained to the patient.  Written consent for procedure obtained. ?Time Out: Verified patient identification, verified procedure, site was marked, verified correct patient position, medications/allergies/relevent history reviewed.  The area was cleaned with alcohol swab.   ?  ?The left patellar tendon was targeted for Extracorporeal shockwave therapy.  ?  ?Preset: patellar tendinitis ?Power Level: 90 ?Frequency: 7 ?Impulse/cycles: 2000 ?Head size: large ?  ?Patient tolerated procedure well without immediate complications. ? ?Follow up in 1 week.  Plan 4 total treatments for this. ?  ? ?

## 2021-10-05 ENCOUNTER — Ambulatory Visit (INDEPENDENT_AMBULATORY_CARE_PROVIDER_SITE_OTHER): Payer: Self-pay | Admitting: Sports Medicine

## 2021-10-05 VITALS — Ht 71.0 in

## 2021-10-05 DIAGNOSIS — M7652 Patellar tendinitis, left knee: Secondary | ICD-10-CM

## 2021-10-05 NOTE — Progress Notes (Signed)
Subjective: Gilbert Briggs is a pleasant 51 year old male who presents for shockwave therapy #2 today.  He had a small amount of soreness a few hours after the first shockwave, but then that resolved after a day.  He has been abstaining from physical activity since the first treatment.  He is hopeful that this will improve his symptoms. ? ?Procedure: ECSWT ?Indications:  left patellar tendinopathy ?  ?Procedure Details ?Consent: Risks of procedure as well as the alternatives and risks of each were explained to the patient.  Written consent for procedure obtained. ?Time Out: Verified patient identification, verified procedure, site was marked, verified correct patient position, medications/allergies/relevent history reviewed.  The area was cleaned with alcohol swab.   ?  ?The left patellar tendon was targeted for Extracorporeal shockwave therapy.  ?  ?Preset: patellar tendinitis ?Power Level: 90 mJ ?Frequency: 8 Hz (increased from 7 Hz last time) ?Impulse/cycles: 2000 ?Head size: large ?  ?Patient tolerated procedure well without immediate complications.  He will continue with 2 additional treatment therapies and then we will reevaluate his pain after session #4. ? ?Elba Barman, DO ?PGY-4, Sports Medicine Fellow ?[place] ? ?This note was dictated using Dragon naturally speaking software and may contain errors in syntax, spelling, or content which have not been identified prior to signing this note.  ? ? Addendum:  I was the preceptor for this visit and available for immediate consultation.  Karlton Lemon MD CAQSM ? ? ?

## 2021-10-12 ENCOUNTER — Ambulatory Visit (INDEPENDENT_AMBULATORY_CARE_PROVIDER_SITE_OTHER): Payer: Self-pay | Admitting: Sports Medicine

## 2021-10-12 DIAGNOSIS — M7652 Patellar tendinitis, left knee: Secondary | ICD-10-CM

## 2021-10-12 NOTE — Progress Notes (Signed)
Subjective: Gilbert Briggs is a pleasant 51 year old male who presents for shockwave therapy #3 today.  He has been trying to rest the knee during his treatment course. Has only had mild benefit so far from ECSWT thus far.   ? ? ?Procedure: ECSWT ?Indications:  left patellar tendinopathy ?  ?Procedure Details ?Consent: Risks of procedure as well as the alternatives and risks of each were explained to the patient.  Written consent for procedure obtained. ?Time Out: Verified patient identification, verified procedure, site was marked, verified correct patient position, medications/allergies/relevent history reviewed.  The area was cleaned with alcohol swab.   ?  ?The left patellar tendon was targeted for Extracorporeal shockwave therapy.  ?  ?Preset: patellar tendinopathy ?Power Level: 90 mJ ?Frequency: 9 Hz (increased from 8 Hz last time) ?Impulse/cycles: 2000 ?Head size: large ? ?Patient tolerated procedure well without immediate complications.  We did discuss he will shoot for somewhere between 4-6 sessions and then take a break to reevaluate how much improvement he is having.  We will see him next week for session #3. ? ?Elba Barman, DO ?PGY-4, Sports Medicine Fellow ?[place] ? ?This note was dictated using Dragon naturally speaking software and may contain errors in syntax, spelling, or content which have not been identified prior to signing this note.  ? ?Addendum:  I was the preceptor for this visit and available for immediate consultation.  Karlton Lemon MD CAQSM ? ?

## 2021-10-19 ENCOUNTER — Ambulatory Visit: Payer: BLUE CROSS/BLUE SHIELD | Admitting: Sports Medicine

## 2021-10-20 ENCOUNTER — Ambulatory Visit (INDEPENDENT_AMBULATORY_CARE_PROVIDER_SITE_OTHER): Payer: Self-pay | Admitting: Sports Medicine

## 2021-10-20 DIAGNOSIS — M7652 Patellar tendinitis, left knee: Secondary | ICD-10-CM

## 2021-10-20 DIAGNOSIS — M7650 Patellar tendinitis, unspecified knee: Secondary | ICD-10-CM | POA: Insufficient documentation

## 2021-10-20 NOTE — Progress Notes (Signed)
ESWT for Patellar tendinitis left ? ?2 years of pain/ was a triathlete and runner ? ?Not seeing a lot of benefit yet ?Has been at 57 energy and 2000 ?Discussed trying higher dose ? ?Medium to large head ?3000 Impulses ?Energy 120 ?Freq 16 ?This was applied to patellar and VMO tendons and soft tissue of left knee ? ?Try higher dose ? ?Add mini-knee bends with heel elevated 3 sets of 30 bid. ? ?Ila Mcgill, MD ?

## 2021-10-20 NOTE — Assessment & Plan Note (Signed)
Tolerated treatment well. ?See note - this is first at higher dose ?

## 2022-01-23 DIAGNOSIS — L812 Freckles: Secondary | ICD-10-CM | POA: Diagnosis not present

## 2022-01-23 DIAGNOSIS — D225 Melanocytic nevi of trunk: Secondary | ICD-10-CM | POA: Diagnosis not present

## 2022-01-23 DIAGNOSIS — D224 Melanocytic nevi of scalp and neck: Secondary | ICD-10-CM | POA: Diagnosis not present

## 2022-01-23 DIAGNOSIS — D2261 Melanocytic nevi of right upper limb, including shoulder: Secondary | ICD-10-CM | POA: Diagnosis not present

## 2022-01-23 DIAGNOSIS — L918 Other hypertrophic disorders of the skin: Secondary | ICD-10-CM | POA: Diagnosis not present

## 2022-05-10 DIAGNOSIS — M25562 Pain in left knee: Secondary | ICD-10-CM | POA: Diagnosis not present

## 2022-05-12 ENCOUNTER — Other Ambulatory Visit: Payer: Self-pay | Admitting: Sports Medicine

## 2022-05-12 DIAGNOSIS — M25562 Pain in left knee: Secondary | ICD-10-CM

## 2022-05-24 ENCOUNTER — Other Ambulatory Visit: Payer: BLUE CROSS/BLUE SHIELD

## 2022-05-28 DIAGNOSIS — I2102 ST elevation (STEMI) myocardial infarction involving left anterior descending coronary artery: Secondary | ICD-10-CM | POA: Diagnosis not present

## 2022-05-28 DIAGNOSIS — I517 Cardiomegaly: Secondary | ICD-10-CM | POA: Diagnosis not present

## 2022-05-28 DIAGNOSIS — I2109 ST elevation (STEMI) myocardial infarction involving other coronary artery of anterior wall: Secondary | ICD-10-CM | POA: Diagnosis not present

## 2022-05-28 DIAGNOSIS — R29818 Other symptoms and signs involving the nervous system: Secondary | ICD-10-CM | POA: Diagnosis not present

## 2022-05-28 DIAGNOSIS — T68XXXA Hypothermia, initial encounter: Secondary | ICD-10-CM | POA: Diagnosis not present

## 2022-05-28 DIAGNOSIS — I959 Hypotension, unspecified: Secondary | ICD-10-CM | POA: Diagnosis not present

## 2022-05-28 DIAGNOSIS — Z9861 Coronary angioplasty status: Secondary | ICD-10-CM | POA: Diagnosis not present

## 2022-05-28 DIAGNOSIS — I213 ST elevation (STEMI) myocardial infarction of unspecified site: Secondary | ICD-10-CM | POA: Diagnosis not present

## 2022-05-28 DIAGNOSIS — Z95818 Presence of other cardiac implants and grafts: Secondary | ICD-10-CM | POA: Diagnosis not present

## 2022-05-28 DIAGNOSIS — R001 Bradycardia, unspecified: Secondary | ICD-10-CM | POA: Diagnosis not present

## 2022-05-28 DIAGNOSIS — G43909 Migraine, unspecified, not intractable, without status migrainosus: Secondary | ICD-10-CM | POA: Diagnosis not present

## 2022-05-28 DIAGNOSIS — R079 Chest pain, unspecified: Secondary | ICD-10-CM | POA: Diagnosis not present

## 2022-05-28 DIAGNOSIS — R072 Precordial pain: Secondary | ICD-10-CM | POA: Diagnosis not present

## 2022-05-28 DIAGNOSIS — R4789 Other speech disturbances: Secondary | ICD-10-CM | POA: Diagnosis not present

## 2022-05-28 DIAGNOSIS — Z951 Presence of aortocoronary bypass graft: Secondary | ICD-10-CM | POA: Diagnosis not present

## 2022-05-28 DIAGNOSIS — R0789 Other chest pain: Secondary | ICD-10-CM | POA: Diagnosis not present

## 2022-05-28 DIAGNOSIS — Z87891 Personal history of nicotine dependence: Secondary | ICD-10-CM | POA: Diagnosis not present

## 2022-05-28 DIAGNOSIS — K219 Gastro-esophageal reflux disease without esophagitis: Secondary | ICD-10-CM | POA: Diagnosis not present

## 2022-05-28 DIAGNOSIS — I2510 Atherosclerotic heart disease of native coronary artery without angina pectoris: Secondary | ICD-10-CM | POA: Diagnosis not present

## 2022-05-28 DIAGNOSIS — E785 Hyperlipidemia, unspecified: Secondary | ICD-10-CM | POA: Diagnosis not present

## 2022-06-19 DIAGNOSIS — I213 ST elevation (STEMI) myocardial infarction of unspecified site: Secondary | ICD-10-CM | POA: Diagnosis not present

## 2022-06-19 DIAGNOSIS — E7800 Pure hypercholesterolemia, unspecified: Secondary | ICD-10-CM | POA: Diagnosis not present

## 2022-07-14 ENCOUNTER — Ambulatory Visit
Admission: RE | Admit: 2022-07-14 | Discharge: 2022-07-14 | Disposition: A | Discharge status: Home / Self Care | Payer: BLUE CROSS/BLUE SHIELD | Source: Ambulatory Visit | Attending: Sports Medicine | Admitting: Sports Medicine

## 2022-07-14 DIAGNOSIS — M25562 Pain in left knee: Secondary | ICD-10-CM

## 2022-07-25 ENCOUNTER — Ambulatory Visit: Payer: BC Managed Care – PPO | Admitting: Neurology

## 2022-07-25 ENCOUNTER — Encounter: Payer: Self-pay | Admitting: Neurology

## 2022-07-25 VITALS — BP 121/75 | HR 71 | Ht 71.0 in | Wt 185.0 lb

## 2022-07-25 DIAGNOSIS — G43009 Migraine without aura, not intractable, without status migrainosus: Secondary | ICD-10-CM

## 2022-07-25 DIAGNOSIS — R42 Dizziness and giddiness: Secondary | ICD-10-CM | POA: Diagnosis not present

## 2022-07-25 MED ORDER — VENLAFAXINE HCL ER 75 MG PO CP24: 75.0000 mg | ORAL_CAPSULE | Freq: Every day | ORAL | 3 refills | Status: DC

## 2022-07-25 NOTE — Patient Instructions (Signed)
Great to see you today! Stay safe out in the rain! I will refill Effexor, try to establish with PCP, I recommend primary care at [facility] I will see you in 1 year unless PCP is willing to refill [facility] :)

## 2022-07-25 NOTE — Progress Notes (Signed)
PATIENT: Gilbert Briggs DOB: 21-Apr-1971  REASON FOR VISIT: Follow up for dizziness, headache  HISTORY FROM: Patient PRIMARY NEUROLOGIST: Dr. Jannifer Franklin, now can be followed by Dr. Billey Gosling   HISTORY OF PRESENT ILLNESS: Today 07/25/22 Here today for follow-up. 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 Remains on Effexor XR 75 mg daily. No problems with it. Occasionally can be motion sensitive, long care rides, can feel imbalance. Nothing like what it was. Rare headache. Wants to leave it like it is.  PCP is retiring.  Update 07/13/21 SS: Gilbert Briggs here today for follow-up with history of dizziness and headaches. Doing well on Effexor-XR 75 mg daily. Still has some triggers of motion (car rides, prolonged computer use) for dizzy symptoms. Since taking the Effexor, night and day difference. Right now doesn't want to start the taper. Virtually zero side effects. Sees PCP.   HISTORY 11/23/2020 Dr. Jannifer Franklin: Gilbert Briggs is a 52 year old right-handed white male with a history of dizziness and headache.  The patient has been placed on Effexor for presumed migraine events, he has done quite well on this medication.  Previously, he did not tolerate the Topamax.  The patient is on 75 mg daily, he is virtually symptom-free with exception that he still has some sensations of false movement when he drives for long distances and then tries to get out and walk.  The patient is able to work on the computer without difficulty, he tries to avoid using elevators as much as possible.  Overall, he is quite satisfied with his current level of functioning.  He is not having any side effects off of the Effexor.   REVIEW OF SYSTEMS: Out of a complete 14 system review of symptoms, the patient complains only of the following symptoms, and all other reviewed systems are negative.  See HPI  ALLERGIES: Allergies  Allergen Reactions   Topamax [Topiramate]     anxiety    HOME MEDICATIONS: Outpatient Medications  Prior to Visit  Medication Sig Dispense Refill   aspirin EC 81 MG tablet Take 1 tablet by mouth daily.     atorvastatin (LIPITOR) 80 MG tablet Take by mouth.     BRILINTA 90 MG TABS tablet Take 90 mg by mouth 2 (two) times daily.     cholecalciferol (VITAMIN D) 1000 units tablet Take 2,000 Units by mouth daily.      lisinopril (ZESTRIL) 2.5 MG tablet Take 1 tablet by mouth daily.     metoprolol succinate (TOPROL-XL) 25 MG 24 hr tablet Take 1 tablet by mouth daily.     Omega-3 Fatty Acids (FISH OIL) 1000 MG CAPS Take 1 capsule by mouth daily.     omeprazole (PRILOSEC) 20 MG capsule Take 1 capsule by mouth daily.     venlafaxine XR (EFFEXOR XR) 75 MG 24 hr capsule Take 1 capsule (75 mg total) by mouth daily with breakfast. 90 capsule 3   ASHWAGANDHA PO Take 350 mg by mouth.     FEVERFEW PO Take 380 mg by mouth. Takes 2 times a wk     fexofenadine (ALLEGRA) 180 MG tablet Take 180 mg by mouth as needed for allergies or rhinitis.     Magnesium 200 MG TABS Take by mouth. Takes 1 tablet 3 times per week for migraine headaches     Multiple Vitamin (MULTI-VITAMINS) TABS Take 1 tablet by mouth daily. Centrum     Olopatadine HCl (PATADAY) 0.2 % SOLN Place 1 drop into both eyes as needed (itchy eyes).  2.5 mL 5   omega-3 acid ethyl esters (LOVAZA) 1 g capsule Take by mouth 2 (two) times daily.     thiamine (VITAMIN B-1) 100 MG tablet Take 100 mg by mouth daily.     No facility-administered medications prior to visit.    PAST MEDICAL HISTORY: Past Medical History:  Diagnosis Date   Common migraine 08/28/2018   Dizziness    GERD (gastroesophageal reflux disease)     PAST SURGICAL HISTORY: Past Surgical History:  Procedure Laterality Date   ESOPHAGEAL DILATION      FAMILY HISTORY: Family History  Problem Relation Age of Onset   High blood pressure Mother    Stroke Mother    Breast cancer Mother    Allergic rhinitis Mother    Asthma Mother    High blood pressure Father    Heart attack  Father    High Cholesterol Father    Allergic rhinitis Sister    Allergic rhinitis Brother    Angioedema Neg Hx    Eczema Neg Hx    Immunodeficiency Neg Hx    Urticaria Neg Hx     SOCIAL HISTORY: Social History   Socioeconomic History   Marital status: Married    Spouse name: Gilbert Briggs   Number of children: 2   Years of education: College   Highest education level: Not on file  Occupational History   Occupation: Conservation officer, historic buildings    Comment: full time  Tobacco Use   Smoking status: Former   Smokeless tobacco: Never  Scientific laboratory technician Use: Never used  Substance and Sexual Activity   Alcohol use: No   Drug use: No   Sexual activity: Not on file  Other Topics Concern   Not on file  Social History Narrative   Lives with wife, Gilbert Briggs and 2 daughters,   Caffeine 1-2 cups daily   Right handed    Social Determinants of Health   Financial Resource Strain: Not on file  Food Insecurity: Not on file  Transportation Needs: Not on file  Physical Activity: Not on file  Stress: Not on file  Social Connections: Not on file  Intimate Partner Violence: Not on file   PHYSICAL EXAM  Vitals:   07/25/22 0840  BP: 121/75  Pulse: 71  Weight: 185 lb (83.9 kg)  Height: '5\' 11"'$  (1.803 m)    Body mass index is 25.8 kg/m.  Generalized: Well developed, in no acute distress   Neurological examination  Mentation: Alert oriented to time, place, history taking. Follows all commands speech and language fluent Cranial nerve II-XII: Pupils were equal round reactive to light. Extraocular movements were full, visual field were full on confrontational test. Facial sensation and strength were normal. Head turning and shoulder shrug  were normal and symmetric. Motor: The motor testing reveals 5 over 5 strength of all 4 extremities. Good symmetric motor tone is noted throughout.  Sensory: Sensory testing is intact to soft touch on all 4 extremities. No evidence of extinction is noted.   Coordination: Cerebellar testing reveals good finger-nose-finger and heel-to-shin bilaterally.  Gait and station: Gait is normal. Tandem gait is normal.   DIAGNOSTIC DATA (LABS, IMAGING, TESTING) - I reviewed patient records, labs, notes, testing and imaging myself where available.  No results found for: "WBC", "HGB", "HCT", "MCV", "PLT" No results found for: "NA", "K", "CL", "CO2", "GLUCOSE", "BUN", "CREATININE", "CALCIUM", "PROT", "ALBUMIN", "AST", "ALT", "ALKPHOS", "BILITOT", "GFRNONAA", "GFRAA" No results found for: "CHOL", "HDL", "LDLCALC", "LDLDIRECT", "TRIG", "CHOLHDL" No results found  for: "HGBA1C" No results found for: "VITAMINB12" No results found for: "TSH"  ASSESSMENT AND PLAN 51 y.o. year old male  has a past medical history of Common migraine (08/28/2018), Dizziness, and GERD (gastroesophageal reflux disease). here with:  1.  Episodic dizziness, headache, likely migraine syndrome  -He continues to do very well, continues to benefit from Effexor -Continue Effexor XR 75 mg daily -Encouraged to establish with new PCP, we will schedule a 1 year follow-up virtually, but can return here as needed PCP will refill  Butler Denmark, AGNP-C, DNP 07/25/2022, 9:02 AM [facility] [address], [place], [place] [address] [phone]

## 2022-07-26 ENCOUNTER — Other Ambulatory Visit: Payer: BLUE CROSS/BLUE SHIELD

## 2022-07-28 DIAGNOSIS — J029 Acute pharyngitis, unspecified: Secondary | ICD-10-CM | POA: Diagnosis not present

## 2022-07-28 DIAGNOSIS — I2510 Atherosclerotic heart disease of native coronary artery without angina pectoris: Secondary | ICD-10-CM | POA: Diagnosis not present

## 2022-07-28 DIAGNOSIS — I252 Old myocardial infarction: Secondary | ICD-10-CM | POA: Diagnosis not present

## 2022-08-24 DIAGNOSIS — M25562 Pain in left knee: Secondary | ICD-10-CM | POA: Diagnosis not present

## 2023-01-29 DIAGNOSIS — I10 Essential (primary) hypertension: Secondary | ICD-10-CM | POA: Diagnosis not present

## 2023-01-29 DIAGNOSIS — I2510 Atherosclerotic heart disease of native coronary artery without angina pectoris: Secondary | ICD-10-CM | POA: Diagnosis not present

## 2023-01-29 DIAGNOSIS — E7800 Pure hypercholesterolemia, unspecified: Secondary | ICD-10-CM | POA: Diagnosis not present

## 2023-04-22 ENCOUNTER — Other Ambulatory Visit: Payer: Self-pay | Admitting: Neurology

## 2023-06-06 DIAGNOSIS — I2510 Atherosclerotic heart disease of native coronary artery without angina pectoris: Secondary | ICD-10-CM | POA: Diagnosis not present

## 2023-06-06 DIAGNOSIS — Z131 Encounter for screening for diabetes mellitus: Secondary | ICD-10-CM | POA: Diagnosis not present

## 2023-06-06 DIAGNOSIS — Z125 Encounter for screening for malignant neoplasm of prostate: Secondary | ICD-10-CM | POA: Diagnosis not present

## 2023-06-06 DIAGNOSIS — Z0000 Encounter for general adult medical examination without abnormal findings: Secondary | ICD-10-CM | POA: Diagnosis not present

## 2023-06-06 DIAGNOSIS — K219 Gastro-esophageal reflux disease without esophagitis: Secondary | ICD-10-CM | POA: Diagnosis not present

## 2023-06-06 DIAGNOSIS — G43909 Migraine, unspecified, not intractable, without status migrainosus: Secondary | ICD-10-CM | POA: Diagnosis not present

## 2023-06-06 DIAGNOSIS — Z23 Encounter for immunization: Secondary | ICD-10-CM | POA: Diagnosis not present

## 2023-07-04 DIAGNOSIS — M25562 Pain in left knee: Secondary | ICD-10-CM | POA: Diagnosis not present

## 2023-07-16 DIAGNOSIS — L820 Inflamed seborrheic keratosis: Secondary | ICD-10-CM | POA: Diagnosis not present

## 2023-07-24 DIAGNOSIS — M899 Disorder of bone, unspecified: Secondary | ICD-10-CM | POA: Diagnosis not present

## 2023-07-26 ENCOUNTER — Telehealth: Payer: BC Managed Care – PPO | Admitting: Neurology

## 2023-07-26 DIAGNOSIS — R42 Dizziness and giddiness: Secondary | ICD-10-CM

## 2023-07-26 DIAGNOSIS — G43009 Migraine without aura, not intractable, without status migrainosus: Secondary | ICD-10-CM | POA: Diagnosis not present

## 2023-07-26 MED ORDER — VENLAFAXINE HCL ER 75 MG PO CP24: 75.0000 mg | ORAL_CAPSULE | Freq: Every day | ORAL | 3 refills | Status: DC

## 2023-07-26 NOTE — Patient Instructions (Signed)
 Great to see you today.  I have sent in a refill of Effexor.  Your primary care doctor can refill going forward.  Please let me know if you have any issues.  Will see you back on an as-needed basis.  Thanks!!

## 2023-07-26 NOTE — Progress Notes (Signed)
 PATIENT: Gilbert Briggs DOB: May 23, 1971  REASON FOR VISIT: Follow up for dizziness, headache  HISTORY FROM: Patient PRIMARY NEUROLOGIST: Dr. Gennaro  Virtual Visit via Video Note  I connected with Gilbert Briggs on 07/26/23 at 10:00 AM EST by a video enabled telemedicine application and verified that I am speaking with the correct person using two identifiers.  Location: Patient: at his home Provider: in the office    I discussed the limitations of evaluation and management by telemedicine and the availability of in person appointments. The patient expressed understanding and agreed to proceed.  HISTORY OF PRESENT ILLNESS: Today 07/26/23 Via VV. Needs refill on Effexor . Dizziness/imbalance issue can be triggered by long car rides prolonged screen time. Huge difference with Effexor . Just now had primary care doctor visit. No headache. Health has been good.   07/25/22 SS: Here today for follow-up. 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 Remains on Effexor  XR 75 mg daily. No problems with it. Occasionally can be motion sensitive, long care rides, can feel imbalance. Nothing like what it was. Rare headache. Wants to leave it like it is.  PCP is retiring.  Update 07/13/21 SS: Gilbert Briggs here today for follow-up with history of dizziness and headaches. Doing well on Effexor -XR 75 mg daily. Still has some triggers of motion (car rides, prolonged computer use) for dizzy symptoms. Since taking the Effexor , night and day difference. Right now doesn't want to start the taper. Virtually zero side effects. Sees PCP.   HISTORY 11/23/2020 Dr. Jenel: Gilbert Briggs is a 52 year old right-handed white male with a history of dizziness and headache.  The patient has been placed on Effexor  for presumed migraine events, he has done quite well on this medication.  Previously, he did not tolerate the Topamax .  The patient is on 75 mg daily, he is virtually symptom-free with exception that he still has  some sensations of false movement when he drives for long distances and then tries to get out and walk.  The patient is able to work on the computer without difficulty, he tries to avoid using elevators as much as possible.  Overall, he is quite satisfied with his current level of functioning.  He is not having any side effects off of the Effexor .   REVIEW OF SYSTEMS: Out of a complete 14 system review of symptoms, the patient complains only of the following symptoms, and all other reviewed systems are negative.  See HPI  ALLERGIES: Allergies  Allergen Reactions   Topamax  [Topiramate ]     anxiety    HOME MEDICATIONS: Outpatient Medications Prior to Visit  Medication Sig Dispense Refill   aspirin EC 81 MG tablet Take 1 tablet by mouth daily.     atorvastatin (LIPITOR) 80 MG tablet Take by mouth.     BRILINTA 90 MG TABS tablet Take 90 mg by mouth 2 (two) times daily.     cholecalciferol (VITAMIN D) 1000 units tablet Take 2,000 Units by mouth daily.      lisinopril (ZESTRIL) 2.5 MG tablet Take 1 tablet by mouth daily.     metoprolol succinate (TOPROL-XL) 25 MG 24 hr tablet Take 1 tablet by mouth daily.     Omega-3 Fatty Acids (FISH OIL) 1000 MG CAPS Take 1 capsule by mouth daily.     omeprazole (PRILOSEC) 20 MG capsule Take 1 capsule by mouth daily.     venlafaxine  XR (EFFEXOR -XR) 75 MG 24 hr capsule TAKE 1 CAPSULE(75 MG) BY MOUTH DAILY WITH BREAKFAST 90 capsule  3   No facility-administered medications prior to visit.    PAST MEDICAL HISTORY: Past Medical History:  Diagnosis Date   Common migraine 08/28/2018   Dizziness    GERD (gastroesophageal reflux disease)     PAST SURGICAL HISTORY: Past Surgical History:  Procedure Laterality Date   ESOPHAGEAL DILATION      FAMILY HISTORY: Family History  Problem Relation Age of Onset   High blood pressure Mother    Stroke Mother    Breast cancer Mother    Allergic rhinitis Mother    Asthma Mother    High blood pressure Father     Heart attack Father    High Cholesterol Father    Allergic rhinitis Sister    Allergic rhinitis Brother    Angioedema Neg Hx    Eczema Neg Hx    Immunodeficiency Neg Hx    Urticaria Neg Hx     SOCIAL HISTORY: Social History   Socioeconomic History   Marital status: Married    Spouse name: Delon   Number of children: 2   Years of education: College   Highest education level: Not on file  Occupational History   Occupation: Landscape Architect    Comment: full time  Tobacco Use   Smoking status: Former   Smokeless tobacco: Never  Advertising Account Planner   Vaping status: Never Used  Substance and Sexual Activity   Alcohol use: No   Drug use: No   Sexual activity: Not on file  Other Topics Concern   Not on file  Social History Narrative   Lives with wife, Delon and 2 daughters,   Caffeine 1-2 cups daily   Right handed    Social Drivers of Health   Financial Resource Strain: Not on file  Food Insecurity: Not on file  Transportation Needs: Not on file  Physical Activity: Not on file  Stress: Not on file  Social Connections: Unknown (11/26/2021)   Received from [facility], [facility]   Social Network    Social Network: Not on file  Intimate Partner Violence: Unknown (10/19/2021)   Received from [facility], [facility]   HITS    Physically Hurt: Not on file    Insult or Talk Down To: Not on file    Threaten Physical Harm: Not on file    Scream or Curse: Not on file   PHYSICAL EXAM  Via video visit, is alert and oriented, speech is clear and concise, facial symmetry, moves about freely  DIAGNOSTIC DATA (LABS, IMAGING, TESTING) - I reviewed patient records, labs, notes, testing and imaging myself where available.  No results found for: WBC, HGB, HCT, MCV, PLT No results found for: NA, K, CL, CO2, GLUCOSE, BUN, CREATININE, CALCIUM, PROT, ALBUMIN, AST, ALT, ALKPHOS, BILITOT, GFRNONAA, GFRAA No results found for: CHOL, HDL,  LDLCALC, LDLDIRECT, TRIG, CHOLHDL No results found for: YHAJ8R No results found for: VITAMINB12 No results found for: TSH  ASSESSMENT AND PLAN 53 y.o. year old male  has a past medical history of Common migraine (08/28/2018), Dizziness, and GERD (gastroesophageal reflux disease). here with:  1.  Episodic dizziness, headache, likely migraine syndrome  -He continues to do very well, continues to benefit from Effexor  -Continue Effexor  XR 75 mg daily, refill sent in -PCP will refill going forward, return here as needed  Meds ordered this encounter  Medications   venlafaxine  XR (EFFEXOR -XR) 75 MG 24 hr capsule    Sig: Take 1 capsule (75 mg total) by mouth daily.    Dispense:  90 capsule    Refill:  3    ZERO refills remain on this prescription. Your patient is requesting advance approval of refills for this medication to PREVENT ANY MISSED DOSES     Lauraine Born, AGNP-C, DNP 07/26/2023, 10:08 AM [facility] [address] [place], [place] [address] [phone]

## 2023-07-31 ENCOUNTER — Other Ambulatory Visit: Payer: Self-pay | Admitting: Medical Genetics

## 2023-09-26 DIAGNOSIS — M958 Other specified acquired deformities of musculoskeletal system: Secondary | ICD-10-CM | POA: Diagnosis not present

## 2023-09-26 DIAGNOSIS — M2242 Chondromalacia patellae, left knee: Secondary | ICD-10-CM | POA: Diagnosis not present

## 2023-09-28 DIAGNOSIS — M6281 Muscle weakness (generalized): Secondary | ICD-10-CM | POA: Diagnosis not present

## 2023-09-28 DIAGNOSIS — R262 Difficulty in walking, not elsewhere classified: Secondary | ICD-10-CM | POA: Diagnosis not present

## 2023-09-28 DIAGNOSIS — M25662 Stiffness of left knee, not elsewhere classified: Secondary | ICD-10-CM | POA: Diagnosis not present

## 2023-09-28 DIAGNOSIS — S8332XD Tear of articular cartilage of left knee, current, subsequent encounter: Secondary | ICD-10-CM | POA: Diagnosis not present

## 2023-10-05 DIAGNOSIS — S8332XD Tear of articular cartilage of left knee, current, subsequent encounter: Secondary | ICD-10-CM | POA: Diagnosis not present

## 2023-10-16 DIAGNOSIS — M25662 Stiffness of left knee, not elsewhere classified: Secondary | ICD-10-CM | POA: Diagnosis not present

## 2023-10-16 DIAGNOSIS — R262 Difficulty in walking, not elsewhere classified: Secondary | ICD-10-CM | POA: Diagnosis not present

## 2023-10-16 DIAGNOSIS — S8332XD Tear of articular cartilage of left knee, current, subsequent encounter: Secondary | ICD-10-CM | POA: Diagnosis not present

## 2023-10-16 DIAGNOSIS — M6281 Muscle weakness (generalized): Secondary | ICD-10-CM | POA: Diagnosis not present

## 2023-10-17 DIAGNOSIS — R1310 Dysphagia, unspecified: Secondary | ICD-10-CM | POA: Diagnosis not present

## 2023-10-17 DIAGNOSIS — K219 Gastro-esophageal reflux disease without esophagitis: Secondary | ICD-10-CM | POA: Diagnosis not present

## 2023-10-17 DIAGNOSIS — R1011 Right upper quadrant pain: Secondary | ICD-10-CM | POA: Diagnosis not present

## 2023-10-24 DIAGNOSIS — R1011 Right upper quadrant pain: Secondary | ICD-10-CM | POA: Diagnosis not present

## 2023-11-20 DIAGNOSIS — K449 Diaphragmatic hernia without obstruction or gangrene: Secondary | ICD-10-CM | POA: Diagnosis not present

## 2023-11-20 DIAGNOSIS — R1319 Other dysphagia: Secondary | ICD-10-CM | POA: Diagnosis not present

## 2023-11-20 DIAGNOSIS — K219 Gastro-esophageal reflux disease without esophagitis: Secondary | ICD-10-CM | POA: Diagnosis not present

## 2023-11-20 DIAGNOSIS — K222 Esophageal obstruction: Secondary | ICD-10-CM | POA: Diagnosis not present

## 2024-02-05 DIAGNOSIS — I10 Essential (primary) hypertension: Secondary | ICD-10-CM | POA: Diagnosis not present

## 2024-02-05 DIAGNOSIS — I2510 Atherosclerotic heart disease of native coronary artery without angina pectoris: Secondary | ICD-10-CM | POA: Diagnosis not present

## 2024-02-05 DIAGNOSIS — E7800 Pure hypercholesterolemia, unspecified: Secondary | ICD-10-CM | POA: Diagnosis not present

## 2024-02-06 DIAGNOSIS — I2510 Atherosclerotic heart disease of native coronary artery without angina pectoris: Secondary | ICD-10-CM | POA: Diagnosis not present

## 2024-03-26 ENCOUNTER — Encounter (HOSPITAL_BASED_OUTPATIENT_CLINIC_OR_DEPARTMENT_OTHER): Payer: Self-pay

## 2024-03-26 ENCOUNTER — Emergency Department (HOSPITAL_BASED_OUTPATIENT_CLINIC_OR_DEPARTMENT_OTHER)
Admission: EM | Admit: 2024-03-26 | Discharge: 2024-03-27 | Disposition: A | Discharge status: Home / Self Care | Attending: Emergency Medicine | Admitting: Emergency Medicine

## 2024-03-26 DIAGNOSIS — T189XXA Foreign body of alimentary tract, part unspecified, initial encounter: Secondary | ICD-10-CM | POA: Insufficient documentation

## 2024-03-26 DIAGNOSIS — Z79899 Other long term (current) drug therapy: Secondary | ICD-10-CM | POA: Insufficient documentation

## 2024-03-26 DIAGNOSIS — T18108A Unspecified foreign body in esophagus causing other injury, initial encounter: Secondary | ICD-10-CM

## 2024-03-26 DIAGNOSIS — Z7982 Long term (current) use of aspirin: Secondary | ICD-10-CM | POA: Diagnosis not present

## 2024-03-26 DIAGNOSIS — W44F3XA Food entering into or through a natural orifice, initial encounter: Secondary | ICD-10-CM | POA: Insufficient documentation

## 2024-03-26 HISTORY — DX: Atherosclerotic heart disease of native coronary artery without angina pectoris: I25.10

## 2024-03-26 NOTE — ED Triage Notes (Signed)
 Pt states he has an issue with his esophagus and has to be stretch periodically.  Tonight eating dinner ate steak and he says it is still stuck.  Pain in center of chest.  Has tried to get it to pass for 3 hours On/off vomiting in triage

## 2024-03-27 MED ORDER — GLUCAGON HCL RDNA (DIAGNOSTIC) 1 MG IJ SOLR
1.0000 mg | Freq: Once | INTRAMUSCULAR | Status: AC
  Administered 2024-03-27: 1 mg via INTRAVENOUS
  Filled 2024-03-27: qty 1

## 2024-03-27 NOTE — ED Provider Notes (Signed)
 [place] [facility]  Provider Note  CSN: [id number] Arrival date & time: 03/26/24 2319  History Chief Complaint  Patient presents with   Swallowed Foreign Body    Gilbert Briggs is a 53 y.o. male with history of esophageal stricture, s/p dilation in the past with GI in [place] reports feeling like he got a piece of steak stuck about 3 hours ago. Has had similar in the past, usually resolves spontaneously. He has had trouble tolerating PO fluids and has been regurgitating.    Home Medications Prior to Admission medications   Medication Sig Start Date End Date Taking? Authorizing Provider  aspirin EC 81 MG tablet Take 1 tablet by mouth daily. 05/31/22   [provider]  atorvastatin (LIPITOR) 80 MG tablet Take by mouth. 05/30/22   [provider]  BRILINTA 90 MG TABS tablet Take 90 mg by mouth 2 (two) times daily.    [provider]  cholecalciferol (VITAMIN D) 1000 units tablet Take 2,000 Units by mouth daily.     [provider]  lisinopril (ZESTRIL) 2.5 MG tablet Take 1 tablet by mouth daily. 05/31/22   [provider]  metoprolol succinate (TOPROL-XL) 25 MG 24 hr tablet Take 1 tablet by mouth daily. 05/31/22   [provider]  Omega-3 Fatty Acids (FISH OIL) 1000 MG CAPS Take 1 capsule by mouth daily.    [provider]  omeprazole (PRILOSEC) 20 MG capsule Take 1 capsule by mouth daily. 03/14/17   [provider]  venlafaxine  XR (EFFEXOR -XR) 75 MG 24 hr capsule Take 1 capsule (75 mg total) by mouth daily. 07/26/23   Gayland Lauraine PARAS, NP     Allergies    Topamax  [topiramate ]   Review of Systems   Review of Systems Please see HPI for pertinent positives and negatives  Physical Exam BP 125/85   Pulse (!) 57   Temp 98.2 F (36.8 C)   Resp 18   Ht 5' 11 (1.803 m)   Wt 88.5 kg   SpO2 97%   BMI 27.20 kg/m   Physical Exam Vitals and nursing note reviewed.  HENT:      Head: Normocephalic.     Nose: Nose normal.  Eyes:     Extraocular Movements: Extraocular movements intact.  Pulmonary:     Effort: Pulmonary effort is normal.  Musculoskeletal:        General: Normal range of motion.     Cervical back: Neck supple.  Skin:    Findings: No rash (on exposed skin).  Neurological:     Mental Status: He is alert and oriented to person, place, and time.  Psychiatric:        Mood and Affect: Mood normal.     ED Results / Procedures / Treatments   EKG None  Procedures Procedures  Medications Ordered in the ED Medications  glucagon  (human recombinant) (GLUCAGEN ) injection 1 mg (1 mg Intravenous Given 03/27/24 0021)    Initial Impression and Plan  Patient here with suspected esophageal food impaction. Will give a dose of glucagon  and reassess.   ED Course   Clinical Course as of 03/27/24 0049  Thu Mar 27, 2024  0043 Patient able to tolerate PO fluids, FB sensation has improved. Plan discharge with continued PPI and follow up with his GI in [place].  [CS]    Clinical Course User Index [CS] Roselyn Carlin NOVAK, MD     MDM Rules/Calculators/A&P Medical Decision Making Problems Addressed: Esophageal  foreign body, initial encounter: acute illness or injury  Risk Prescription drug management.     Final Clinical Impression(s) / ED Diagnoses Final diagnoses:  Esophageal foreign body, initial encounter    Rx / DC Orders ED Discharge Orders     None        Roselyn Carlin NOVAK, MD 03/27/24 [phone]

## 2024-03-27 NOTE — ED Notes (Signed)
 Patient states the feeling of the steak being stuck in his esophagus has passed and he is able to swallow his drink without anything coming up.

## 2024-04-22 ENCOUNTER — Other Ambulatory Visit: Payer: Self-pay

## 2024-04-22 MED ORDER — VENLAFAXINE HCL ER 75 MG PO CP24: 75.0000 mg | ORAL_CAPSULE | Freq: Every day | ORAL | 3 refills | Status: AC

## 2024-05-16 ENCOUNTER — Other Ambulatory Visit: Payer: Self-pay | Admitting: Medical Genetics

## 2024-05-16 DIAGNOSIS — Z006 Encounter for examination for normal comparison and control in clinical research program: Secondary | ICD-10-CM

## 2024-06-06 DIAGNOSIS — I2510 Atherosclerotic heart disease of native coronary artery without angina pectoris: Secondary | ICD-10-CM | POA: Diagnosis not present

## 2024-06-06 DIAGNOSIS — Z131 Encounter for screening for diabetes mellitus: Secondary | ICD-10-CM | POA: Diagnosis not present

## 2024-06-06 DIAGNOSIS — K219 Gastro-esophageal reflux disease without esophagitis: Secondary | ICD-10-CM | POA: Diagnosis not present

## 2024-06-06 DIAGNOSIS — Z0000 Encounter for general adult medical examination without abnormal findings: Secondary | ICD-10-CM | POA: Diagnosis not present

## 2024-06-06 DIAGNOSIS — Z125 Encounter for screening for malignant neoplasm of prostate: Secondary | ICD-10-CM | POA: Diagnosis not present

## 2024-06-06 DIAGNOSIS — I10 Essential (primary) hypertension: Secondary | ICD-10-CM | POA: Diagnosis not present

## 2024-06-06 DIAGNOSIS — Z23 Encounter for immunization: Secondary | ICD-10-CM | POA: Diagnosis not present

## 2024-06-06 DIAGNOSIS — E782 Mixed hyperlipidemia: Secondary | ICD-10-CM | POA: Diagnosis not present

## 2024-06-06 DIAGNOSIS — E559 Vitamin D deficiency, unspecified: Secondary | ICD-10-CM | POA: Diagnosis not present
